# Patient Record
Sex: Female | Born: 1980
Health system: Southern US, Community
[De-identification: ages and names within clinical notes are randomized; demographics above are authoritative.]

## PROBLEM LIST (undated history)

## (undated) DIAGNOSIS — I8393 Asymptomatic varicose veins of bilateral lower extremities: Secondary | ICD-10-CM

## (undated) HISTORY — DX: Asymptomatic varicose veins of bilateral lower extremities: I83.93

## (undated) HISTORY — PX: CHOLECYSTECTOMY: SHX55

## (undated) HISTORY — PX: LASER ABLATION: SHX1947

---

## 2007-04-30 ENCOUNTER — Ambulatory Visit: Payer: Self-pay | Admitting: Family Medicine

## 2007-09-20 ENCOUNTER — Inpatient Hospital Stay: Payer: Self-pay | Admitting: Obstetrics and Gynecology

## 2011-03-12 ENCOUNTER — Ambulatory Visit: Payer: Self-pay | Admitting: Family Medicine

## 2011-08-09 ENCOUNTER — Inpatient Hospital Stay: Payer: Self-pay | Admitting: Obstetrics and Gynecology

## 2011-08-09 LAB — CBC WITH DIFFERENTIAL/PLATELET
Basophil %: 0.5 %
Eosinophil #: 0 10*3/uL (ref 0.0–0.7)
Eosinophil %: 0 %
HCT: 35.1 % (ref 35.0–47.0)
HGB: 12 g/dL (ref 12.0–16.0)
Lymphocyte #: 0.9 10*3/uL — ABNORMAL LOW (ref 1.0–3.6)
Lymphocyte %: 8.1 %
MCHC: 34.3 g/dL (ref 32.0–36.0)
Neutrophil #: 9.2 10*3/uL — ABNORMAL HIGH (ref 1.4–6.5)
RBC: 3.63 10*6/uL — ABNORMAL LOW (ref 3.80–5.20)

## 2011-08-10 LAB — HEMATOCRIT: HCT: 34.2 % — ABNORMAL LOW (ref 35.0–47.0)

## 2014-01-27 ENCOUNTER — Inpatient Hospital Stay: Payer: Self-pay | Admitting: Surgery

## 2014-01-27 LAB — LIPASE, BLOOD: Lipase: 171 U/L (ref 73–393)

## 2014-01-27 LAB — TROPONIN I: Troponin-I: <0.02 ng/mL

## 2014-01-27 LAB — CBC
HCT: 40.8 % (ref 35.0–47.0)
HGB: 13.3 g/dL (ref 12.0–16.0)
MCH: 31 pg (ref 26.0–34.0)
MCHC: 32.6 g/dL (ref 32.0–36.0)
MCV: 95 fL (ref 80–100)
Platelet: 162 10*3/uL (ref 150–440)
RBC: 4.3 10*6/uL (ref 3.80–5.20)
RDW: 12.7 % (ref 11.5–14.5)
WBC: 8.7 10*3/uL (ref 3.6–11.0)

## 2014-01-27 LAB — BASIC METABOLIC PANEL
ANION GAP: 12 (ref 7–16)
BUN: 16 mg/dL (ref 7–18)
CALCIUM: 8.7 mg/dL (ref 8.5–10.1)
CREATININE: 0.74 mg/dL (ref 0.60–1.30)
Chloride: 104 mmol/L (ref 98–107)
Co2: 23 mmol/L (ref 21–32)
EGFR (Non-African Amer.): >60
GLUCOSE: 131 mg/dL — AB (ref 65–99)
OSMOLALITY: 281 (ref 275–301)
POTASSIUM: 3.6 mmol/L (ref 3.5–5.1)
Sodium: 139 mmol/L (ref 136–145)

## 2014-01-27 LAB — HEPATIC FUNCTION PANEL A (ARMC)
ALT: 42 U/L
AST: 47 U/L — AB (ref 15–37)
Albumin: 4.1 g/dL (ref 3.4–5.0)
Alkaline Phosphatase: 51 U/L
Bilirubin, Direct: 0.3 mg/dL — ABNORMAL HIGH (ref 0.00–0.20)
Bilirubin,Total: 0.7 mg/dL (ref 0.2–1.0)
TOTAL PROTEIN: 7.5 g/dL (ref 6.4–8.2)

## 2014-01-28 LAB — CBC WITH DIFFERENTIAL/PLATELET
Basophil #: 0 10*3/uL (ref 0.0–0.1)
Basophil %: 0.5 %
Eosinophil #: 0.1 10*3/uL (ref 0.0–0.7)
Eosinophil %: 1 %
HCT: 40.9 % (ref 35.0–47.0)
HGB: 13.6 g/dL (ref 12.0–16.0)
Lymphocyte #: 2.4 10*3/uL (ref 1.0–3.6)
Lymphocyte %: 36.2 %
MCH: 31.5 pg (ref 26.0–34.0)
MCHC: 33.3 g/dL (ref 32.0–36.0)
MCV: 94 fL (ref 80–100)
Monocyte #: 0.5 x10 3/mm (ref 0.2–0.9)
Monocyte %: 7.9 %
Neutrophil #: 3.6 10*3/uL (ref 1.4–6.5)
Neutrophil %: 54.4 %
Platelet: 151 10*3/uL (ref 150–440)
RBC: 4.33 10*6/uL (ref 3.80–5.20)
RDW: 13.1 % (ref 11.5–14.5)
WBC: 6.7 10*3/uL (ref 3.6–11.0)

## 2014-01-28 LAB — COMPREHENSIVE METABOLIC PANEL
Albumin: 3.4 g/dL (ref 3.4–5.0)
Alkaline Phosphatase: 58 U/L
Anion Gap: 9 (ref 7–16)
BUN: 10 mg/dL (ref 7–18)
Bilirubin,Total: 1 mg/dL (ref 0.2–1.0)
Calcium, Total: 8.1 mg/dL — ABNORMAL LOW (ref 8.5–10.1)
Chloride: 106 mmol/L (ref 98–107)
Co2: 26 mmol/L (ref 21–32)
Creatinine: 0.7 mg/dL (ref 0.60–1.30)
EGFR (African American): >60
EGFR (Non-African Amer.): >60
Glucose: 87 mg/dL (ref 65–99)
Osmolality: 280 (ref 275–301)
Potassium: 3.6 mmol/L (ref 3.5–5.1)
SGOT(AST): 115 U/L — ABNORMAL HIGH (ref 15–37)
SGPT (ALT): 144 U/L — ABNORMAL HIGH
Sodium: 141 mmol/L (ref 136–145)
Total Protein: 6.5 g/dL (ref 6.4–8.2)

## 2014-02-01 LAB — PATHOLOGY REPORT

## 2014-10-07 NOTE — Op Note (Signed)
PATIENT NAME:  [name], Angie A MR#:  [id number] DATE OF BIRTH:  Nov 19, 1980  DATE OF PROCEDURE:  01/28/2014  PREOPERATIVE DIAGNOSIS:  Acute cholecystitis.   POSTOPERATIVE DIAGNOSIS:  Acute cholecystitis.   PROCEDURE PERFORMED:  Laparoscopic cholecystectomy.   ANESTHESIA: General endotracheal.   SPECIMEN: Gallbladder.   COMPLICATIONS: None.   INDICATION FOR SURGERY: Angie Graves is a pleasant 34 year old who presents with 1 day of right upper quadrant pain which is recurrent. She was noted to have what appeared to be large stones, potentially nonmobile, stuck in the neck of the gallbladder. She was thus brought to the operating room for laparoscopic cholecystectomy.   DETAILS OF PROCEDURE: Informed consent informed consent was obtained. Angie Graves was brought to the operating room suite. She was induced. Endotracheal tube was placed. General anesthesia was administered. Her abdomen was prepped and draped in standard surgical fashion. A timeout was then performed correctly identifying the patient name, operative site and procedure to be performed.   A supraumbilical incision made and was deepened down to the fascia. The fascia was incised. The peritoneum was entered. Two stay sutures were placed in the fasciotomy. A Hasson trocar was placed in the abdomen. The abdomen was insufflated. A camera was placed. The gallbladder was visualized. An 11-mm epigastric trocar and two 5-mm right subcostal trocars were placed at the midclavicular and anterior axillary lines. The gallbladder was then lifted over the dome of the liver. The cystic artery and cystic duct were dissected out. A critical view was obtained. They were clipped and ligated. The gallbladder was then taken off the gallbladder fossa and brought out through an Endo Catch bag through the umbilical incision. The gallbladder fossa was made hemostatic.   The abdomen was then irrigated. All ports were taken down under direct visualization. The  abdomen was desufflated. The supraumbilical fascia was closed with figure-of-eight 0-Vicryl. All incisions were infiltrated with 1% lidocaine with epinephrine and closed with interrupted 4-0 Monocryl deep dermal sutures. Steri-Strips, Telfa gauze and Tegaderm were then used to complete the dressing. The patient was then awakened, extubated, and brought to the postanesthesia care unit. There were no immediate complications. Needle, sponge, and instrument counts were correct at the end of the procedure.    ____________________________ Si Raiderhristopher A. Ovadia Lopp, MD cal:lt D: 01/29/2014 10:00:40 ET T: 01/29/2014 11:14:31 ET JOB#: [id number]  cc: Cristal Deerhristopher A. Jyllian Haynie, MD, <Dictator> Jarvis NewcomerHRISTOPHER A Jas Betten MD ELECTRONICALLY SIGNED 02/06/2014 21:08

## 2014-10-07 NOTE — H&P (Signed)
   Subjective/Chief Complaint Epigastric/RUQ pain x 5 hours, recurrent   History of Present Illness Angie Graves is a pleasant 34 yo F with relatively no PMH who presents with approx 5 hours of epigastric and RUQ pain.  Occured approx 1 pm after eating oatmeal.  Had similar episode 2 days ago which resolved spontaneously.  Prior episode 5/10, this one 10/10 but much improved.  No N/V/D.  No unusual ingestions, no sick contacts.  U/S shows multiple gallstones without pericholecystic fluid and borderline gb wall thickening at 5 mm   Code Status Full Code   Past Med/Surgical Hx:  Denies:   ALLERGIES:  No Known Allergies:   Family and Social History:  Family History Negative   Social History negative tobacco, negative ETOH   + Tobacco Current (within 1 year)   Place of Living Home   Review of Systems:  Subjective/Chief Complaint RUQ pain/Epigastric pain, recurrent   Fever/Chills No   Cough No   Sputum No   Abdominal Pain Yes   Diarrhea No   Constipation No   Nausea/Vomiting No   SOB/DOE No   Chest Pain No   Dysuria No   Tolerating Diet Yes   Physical Exam:  GEN well developed, well nourished, no acute distress   HEENT pink conjunctivae, PERRL, moist oral mucosa   RESP normal resp effort  clear BS  no use of accessory muscles   CARD regular rate  no murmur  no thrills   ABD denies tenderness  no hernia  soft  normal BS  no Abdominal Bruits  no Adominal Mass   EXTR negative cyanosis/clubbing, negative edema   SKIN normal to palpation, No rashes, No ulcers   NEURO cranial nerves intact, negative rigidity, negative tremor, follows commands, strength:, motor/sensory function intact   PSYCH alert, A+O to time, place, person, good insight    Assessment/Admission Diagnosis 34 yo F with RUQ/epigastric pain, gallstones.  Recurrent.  Likely symptomatic cholelithiasis   Plan I have offered Angie Graves cholecystectomy.  Will admit for resuscitation and pain  control.  Plan for cholecystectomy tomorrow.   Electronic Signatures: Jarvis NewcomerLundquist, Markavious Micco A (MD)  (Signed 14-Aug-15 18:48)  Authored: CHIEF COMPLAINT and HISTORY, PAST MEDICAL/SURGIAL HISTORY, ALLERGIES, FAMILY AND SOCIAL HISTORY, REVIEW OF SYSTEMS, PHYSICAL EXAM, ASSESSMENT AND PLAN   Last Updated: 14-Aug-15 18:48 by Jarvis NewcomerLundquist, Jarren Para A (MD)

## 2014-10-24 NOTE — H&P (Signed)
L&D Evaluation:  History:   HPI 34 y/o G2P1101 @ 39+wks EDC 08/13/11 arrives with c/o contractions beginnng this am, denies leaking fluid, sm show baby is active. [facility]    Presents with contractions    Patient's Medical History No Chronic Illness    Patient's Surgical History none    Medications Pre Natal Vitamins    Allergies NKDA    Social History none    Family History Non-Contributory   ROS:   ROS All systems were reviewed.  HEENT, CNS, GI, GU, Respiratory, CV, Renal and Musculoskeletal systems were found to be normal.   Exam:   Vital Signs stable    Urine Protein not completed    General no apparent distress    Mental Status clear    Chest clear    Heart normal sinus rhythm    Abdomen gravid, non-tender    Estimated Fetal Weight Average for gestational age    Fetal Position vtx    Fundal Height term    Back no CVAT    Edema no edema    Reflexes 1+    Clonus negative    Pelvic no external lesions, 5cm BBOW nl show per RN exam    Mebranes Intact    FHT normal rate with no decels, 130's 140's avg variability occ accels (variability reduced after pain meds)    Ucx regular, Q 4/6 mins    Skin dry    Lymph no lymphadenopathy   Impression:   Impression active labor   Plan:   Plan monitor contractions and for cervical change, antibiotics for GBBS prophylaxis    Comments Admitted, knows what to expect 2nd baby. IV pain meds have helped/have slowed uc's since arrival. IV ABX infusing. Partner supportive at bedside.   Electronic Signatures: Albertina ParrLugiano, Julionna Marczak B (CNM)  (Signed 23-Feb-13 13:19)  Authored: L&D Evaluation   Last Updated: 23-Feb-13 13:19 by Albertina ParrLugiano, Maralyn Witherell B (CNM)

## 2016-01-13 DIAGNOSIS — R102 Pelvic and perineal pain: Secondary | ICD-10-CM | POA: Insufficient documentation

## 2016-03-17 ENCOUNTER — Ambulatory Visit (INDEPENDENT_AMBULATORY_CARE_PROVIDER_SITE_OTHER): Payer: 59 | Admitting: Vascular Surgery

## 2016-04-10 ENCOUNTER — Encounter (INDEPENDENT_AMBULATORY_CARE_PROVIDER_SITE_OTHER): Payer: Self-pay | Admitting: Vascular Surgery

## 2016-04-10 ENCOUNTER — Ambulatory Visit (INDEPENDENT_AMBULATORY_CARE_PROVIDER_SITE_OTHER): Payer: 59 | Admitting: Vascular Surgery

## 2016-04-10 ENCOUNTER — Ambulatory Visit (INDEPENDENT_AMBULATORY_CARE_PROVIDER_SITE_OTHER): Payer: Self-pay | Admitting: Vascular Surgery

## 2016-04-10 VITALS — BP 111/79 | HR 68 | Resp 17 | Ht 63.0 in | Wt 132.0 lb

## 2016-04-10 DIAGNOSIS — I8312 Varicose veins of left lower extremity with inflammation: Secondary | ICD-10-CM | POA: Diagnosis not present

## 2016-04-10 DIAGNOSIS — I8311 Varicose veins of right lower extremity with inflammation: Secondary | ICD-10-CM

## 2016-04-10 DIAGNOSIS — I83813 Varicose veins of bilateral lower extremities with pain: Secondary | ICD-10-CM

## 2016-04-10 NOTE — Progress Notes (Signed)
Assessment & Plan  Varicose veins of lower extremity with inflammation (454.1  I83.10) Impression: Recommend:  The patient has had successful ablation of the previously incompetent saphenous venous system but still has persistent symptoms of pain and swelling that are having a negative impact on daily life and daily activities.  Patient should undergo injection sclerotherapy to treat the residual varicosities of both legs.  The risks, benefits and alternative therapies were reviewed in detail with the patient. All questions were answered. The patient agrees to proceed with sclerotherapy at their convenience.  The patient will continue wearing the graduated compression stockings and using the over-the-counter pain medications to treat her symptoms. Current Plans Indication: Patient presents with symptomatic varicose veins of the lower extremities bilateral.  Procedure: Sclerotherapy using hypertonic saline mixed with 1% Lidocaine was performed on lower extremities bilateral. Compression wraps were placed. The patient tolerated the procedure well.  Plan: Follow up as needed.

## 2016-05-15 ENCOUNTER — Ambulatory Visit (INDEPENDENT_AMBULATORY_CARE_PROVIDER_SITE_OTHER): Payer: 59 | Admitting: Vascular Surgery

## 2016-05-29 ENCOUNTER — Ambulatory Visit (INDEPENDENT_AMBULATORY_CARE_PROVIDER_SITE_OTHER): Payer: 59 | Admitting: Vascular Surgery

## 2016-05-29 ENCOUNTER — Encounter (INDEPENDENT_AMBULATORY_CARE_PROVIDER_SITE_OTHER): Payer: Self-pay | Admitting: Vascular Surgery

## 2016-05-29 DIAGNOSIS — M79604 Pain in right leg: Secondary | ICD-10-CM

## 2016-05-29 DIAGNOSIS — I83813 Varicose veins of bilateral lower extremities with pain: Secondary | ICD-10-CM

## 2016-05-29 DIAGNOSIS — M79609 Pain in unspecified limb: Secondary | ICD-10-CM | POA: Insufficient documentation

## 2016-05-29 DIAGNOSIS — M79605 Pain in left leg: Secondary | ICD-10-CM

## 2016-05-29 DIAGNOSIS — I872 Venous insufficiency (chronic) (peripheral): Secondary | ICD-10-CM | POA: Insufficient documentation

## 2016-05-29 NOTE — Progress Notes (Signed)
    MRN : [id number]  Angie Graves is a 35 y.o. (12/07/1980) female who presents with chief complaint of No chief complaint on file. .   Procedure:  Sclerotherapy using hypertonic saline mixed with 1% Lidocaine was performed on lower extremities bilateral.  Compression wraps were placed.  The patient tolerated the procedure well.  Plan:  Follow up as arranged

## 2016-06-26 ENCOUNTER — Encounter (INDEPENDENT_AMBULATORY_CARE_PROVIDER_SITE_OTHER): Payer: Self-pay | Admitting: Vascular Surgery

## 2016-06-26 ENCOUNTER — Ambulatory Visit (INDEPENDENT_AMBULATORY_CARE_PROVIDER_SITE_OTHER): Payer: 59 | Admitting: Vascular Surgery

## 2016-06-26 VITALS — BP 99/65 | HR 77 | Resp 16 | Ht 63.0 in | Wt 134.0 lb

## 2016-06-26 DIAGNOSIS — I83813 Varicose veins of bilateral lower extremities with pain: Secondary | ICD-10-CM

## 2016-06-26 DIAGNOSIS — M79604 Pain in right leg: Secondary | ICD-10-CM | POA: Diagnosis not present

## 2016-06-26 DIAGNOSIS — I872 Venous insufficiency (chronic) (peripheral): Secondary | ICD-10-CM | POA: Diagnosis not present

## 2016-06-26 DIAGNOSIS — M79605 Pain in left leg: Secondary | ICD-10-CM | POA: Diagnosis not present

## 2016-06-29 NOTE — Progress Notes (Signed)
   Indication:  Patient presents with symptomatic varicose veins of the bilateral lower extremity.  Procedure:  Sclerotherapy using hypertonic saline mixed with 1% Lidocaine was performed on the bilateral lower extremity.  Compression wraps were placed.  The patient tolerated the procedure well.  Plan:  Follow up as needed.  

## 2016-07-09 DIAGNOSIS — R112 Nausea with vomiting, unspecified: Secondary | ICD-10-CM | POA: Diagnosis not present

## 2016-07-09 DIAGNOSIS — J069 Acute upper respiratory infection, unspecified: Secondary | ICD-10-CM | POA: Diagnosis not present

## 2016-07-31 ENCOUNTER — Ambulatory Visit (INDEPENDENT_AMBULATORY_CARE_PROVIDER_SITE_OTHER): Payer: 59 | Admitting: Vascular Surgery

## 2016-07-31 VITALS — BP 108/70 | HR 69 | Resp 16 | Ht 63.0 in | Wt 132.0 lb

## 2016-07-31 DIAGNOSIS — I872 Venous insufficiency (chronic) (peripheral): Secondary | ICD-10-CM | POA: Diagnosis not present

## 2016-07-31 DIAGNOSIS — I83813 Varicose veins of bilateral lower extremities with pain: Secondary | ICD-10-CM | POA: Diagnosis not present

## 2016-07-31 NOTE — Progress Notes (Signed)
    MRN : [id number]  Angie HamburgerMaria A Alvarenga is a 36 y.o. (06/06/1981) female who presents with chief complaint of  Chief Complaint  Patient presents with  . Varicose Veins    Sclerotherapy  .   Procedure:  Sclerotherapy using hypertonic saline mixed with 1% Lidocaine was performed on lower extremities bilateral.  Compression wraps were placed.  The patient tolerated the procedure well.  Plan:  Follow up as arranged

## 2016-08-26 DIAGNOSIS — Z01419 Encounter for gynecological examination (general) (routine) without abnormal findings: Secondary | ICD-10-CM | POA: Diagnosis not present

## 2016-08-26 DIAGNOSIS — Z0000 Encounter for general adult medical examination without abnormal findings: Secondary | ICD-10-CM | POA: Diagnosis not present

## 2016-08-29 DIAGNOSIS — Z0000 Encounter for general adult medical examination without abnormal findings: Secondary | ICD-10-CM | POA: Diagnosis not present

## 2016-10-01 DIAGNOSIS — L708 Other acne: Secondary | ICD-10-CM | POA: Diagnosis not present

## 2016-10-01 DIAGNOSIS — L700 Acne vulgaris: Secondary | ICD-10-CM | POA: Diagnosis not present

## 2016-10-27 DIAGNOSIS — I8393 Asymptomatic varicose veins of bilateral lower extremities: Secondary | ICD-10-CM | POA: Insufficient documentation

## 2016-10-27 DIAGNOSIS — Z0000 Encounter for general adult medical examination without abnormal findings: Secondary | ICD-10-CM | POA: Diagnosis not present

## 2016-10-27 DIAGNOSIS — L700 Acne vulgaris: Secondary | ICD-10-CM | POA: Insufficient documentation

## 2017-01-08 DIAGNOSIS — N889 Noninflammatory disorder of cervix uteri, unspecified: Secondary | ICD-10-CM | POA: Diagnosis not present

## 2017-01-08 DIAGNOSIS — N870 Mild cervical dysplasia: Secondary | ICD-10-CM | POA: Diagnosis not present

## 2017-01-29 DIAGNOSIS — R102 Pelvic and perineal pain: Secondary | ICD-10-CM | POA: Diagnosis not present

## 2017-01-29 DIAGNOSIS — N921 Excessive and frequent menstruation with irregular cycle: Secondary | ICD-10-CM | POA: Diagnosis not present

## 2017-04-28 DIAGNOSIS — I8393 Asymptomatic varicose veins of bilateral lower extremities: Secondary | ICD-10-CM | POA: Diagnosis not present

## 2017-04-28 DIAGNOSIS — L700 Acne vulgaris: Secondary | ICD-10-CM | POA: Diagnosis not present

## 2017-04-28 DIAGNOSIS — J069 Acute upper respiratory infection, unspecified: Secondary | ICD-10-CM | POA: Diagnosis not present

## 2017-06-01 DIAGNOSIS — Z23 Encounter for immunization: Secondary | ICD-10-CM | POA: Diagnosis not present

## 2017-08-27 DIAGNOSIS — Z01419 Encounter for gynecological examination (general) (routine) without abnormal findings: Secondary | ICD-10-CM | POA: Diagnosis not present

## 2017-09-10 DIAGNOSIS — H60503 Unspecified acute noninfective otitis externa, bilateral: Secondary | ICD-10-CM | POA: Diagnosis not present

## 2017-10-28 DIAGNOSIS — I8393 Asymptomatic varicose veins of bilateral lower extremities: Secondary | ICD-10-CM | POA: Diagnosis not present

## 2017-10-28 DIAGNOSIS — L700 Acne vulgaris: Secondary | ICD-10-CM | POA: Diagnosis not present

## 2017-10-28 DIAGNOSIS — Z0000 Encounter for general adult medical examination without abnormal findings: Secondary | ICD-10-CM | POA: Diagnosis not present

## 2018-04-30 DIAGNOSIS — L700 Acne vulgaris: Secondary | ICD-10-CM | POA: Diagnosis not present

## 2018-04-30 DIAGNOSIS — H60509 Unspecified acute noninfective otitis externa, unspecified ear: Secondary | ICD-10-CM | POA: Diagnosis not present

## 2018-04-30 DIAGNOSIS — I8393 Asymptomatic varicose veins of bilateral lower extremities: Secondary | ICD-10-CM | POA: Diagnosis not present

## 2018-10-25 DIAGNOSIS — Z131 Encounter for screening for diabetes mellitus: Secondary | ICD-10-CM | POA: Diagnosis not present

## 2018-10-25 DIAGNOSIS — Z13220 Encounter for screening for lipoid disorders: Secondary | ICD-10-CM | POA: Diagnosis not present

## 2018-10-25 DIAGNOSIS — Z0000 Encounter for general adult medical examination without abnormal findings: Secondary | ICD-10-CM | POA: Diagnosis not present

## 2018-10-25 DIAGNOSIS — R8290 Unspecified abnormal findings in urine: Secondary | ICD-10-CM | POA: Diagnosis not present

## 2018-11-01 DIAGNOSIS — L700 Acne vulgaris: Secondary | ICD-10-CM | POA: Diagnosis not present

## 2018-11-01 DIAGNOSIS — H6093 Unspecified otitis externa, bilateral: Secondary | ICD-10-CM | POA: Diagnosis not present

## 2018-11-01 DIAGNOSIS — Z0000 Encounter for general adult medical examination without abnormal findings: Secondary | ICD-10-CM | POA: Diagnosis not present

## 2019-06-03 ENCOUNTER — Ambulatory Visit (INDEPENDENT_AMBULATORY_CARE_PROVIDER_SITE_OTHER): Payer: 59 | Admitting: Nurse Practitioner

## 2019-06-03 ENCOUNTER — Other Ambulatory Visit: Payer: Self-pay

## 2019-06-03 ENCOUNTER — Encounter (INDEPENDENT_AMBULATORY_CARE_PROVIDER_SITE_OTHER): Payer: Self-pay

## 2019-06-03 ENCOUNTER — Encounter (INDEPENDENT_AMBULATORY_CARE_PROVIDER_SITE_OTHER): Payer: Self-pay | Admitting: Nurse Practitioner

## 2019-06-03 VITALS — BP 110/68 | HR 76 | Resp 16 | Wt 154.6 lb

## 2019-06-03 DIAGNOSIS — I83813 Varicose veins of bilateral lower extremities with pain: Secondary | ICD-10-CM

## 2019-06-03 DIAGNOSIS — M79604 Pain in right leg: Secondary | ICD-10-CM

## 2019-06-03 DIAGNOSIS — M79605 Pain in left leg: Secondary | ICD-10-CM | POA: Diagnosis not present

## 2019-06-03 DIAGNOSIS — Z30430 Encounter for insertion of intrauterine contraceptive device: Secondary | ICD-10-CM | POA: Insufficient documentation

## 2019-06-04 ENCOUNTER — Encounter (INDEPENDENT_AMBULATORY_CARE_PROVIDER_SITE_OTHER): Payer: Self-pay | Admitting: Nurse Practitioner

## 2019-06-04 NOTE — Progress Notes (Signed)
SUBJECTIVE:  Patient ID: Angie Graves, female    DOB: 1981/06/01, 38 y.o.   MRN: [id number] Chief Complaint  Patient presents with  . Follow-up    ref Hande for varicose veins    HPI  Angie Graves is a 38 y.o. female that has been seen in our practice previously for varicose veins.  The patient does report that she had an ablation previously and records show that this was likely done sometime in 2017.  The patient also underwent sclerotherapy for her varicosities bilaterally.  Since that time the patient stated that the pain was better however it is returned.  She also states that her varicosities are much more visible and protruding and it has now become a dull, achy, throbbing and itching sensation.  This causes her significant trouble throughout the day.  She states that this is worse at the end of the day after she has been up on her feet all day.  And make certain everyday activities of daily living difficult such as grocery shopping and cleaning her home.  She denies any fever, chills, nausea, vomiting or diarrhea.  She denies any chest pain or shortness of breath.  Past Medical History:  Diagnosis Date  . Varicose veins of both lower extremities     Past Surgical History:  Procedure Laterality Date  . CHOLECYSTECTOMY    . LASER ABLATION Bilateral     Social History   Socioeconomic History  . Marital status: Single    Spouse name: Not on file  . Number of children: Not on file  . Years of education: Not on file  . Highest education level: Not on file  Occupational History  . Not on file  Tobacco Use  . Smoking status: Never Smoker  . Smokeless tobacco: Never Used  Substance and Sexual Activity  . Alcohol use: No  . Drug use: No  . Sexual activity: Not on file  Other Topics Concern  . Not on file  Social History Narrative  . Not on file   Social Determinants of Health   Financial Resource Strain:   . Difficulty of Paying Living Expenses: Not on file    Food Insecurity:   . Worried About Charity fundraiser in the Last Year: Not on file  . Ran Out of Food in the Last Year: Not on file  Transportation Needs:   . Lack of Transportation (Medical): Not on file  . Lack of Transportation (Non-Medical): Not on file  Physical Activity:   . Days of Exercise per Week: Not on file  . Minutes of Exercise per Session: Not on file  Stress:   . Feeling of Stress : Not on file  Social Connections:   . Frequency of Communication with Friends and Family: Not on file  . Frequency of Social Gatherings with Friends and Family: Not on file  . Attends Religious Services: Not on file  . Active Member of Clubs or Organizations: Not on file  . Attends Archivist Meetings: Not on file  . Marital Status: Not on file  Intimate Partner Violence:   . Fear of Current or Ex-Partner: Not on file  . Emotionally Abused: Not on file  . Physically Abused: Not on file  . Sexually Abused: Not on file    Family History  Family history unknown: Yes    No Known Allergies   Review of Systems   Review of Systems: Negative Unless Checked Constitutional: [] Weight loss  [] Fever  []   Chills Cardiac: Chest pain    Atrial Fibrillation  Palpitations   Shortness of breath when laying flat   Shortness of breath with exertion. Shortness of breath at rest Vascular:  Pain in legs with walking   Pain in legs with standing Pain in legs when laying flat   Claudication    Pain in feet when laying flat    History of DVT   Phlebitis   Swelling in legs   Varicose veins   Non-healing ulcers Pulmonary:   Uses home oxygen   Productive cough   Hemoptysis   Wheeze  COPD   Asthma Neurologic:  Dizziness   Seizures  Blackouts History of stroke   History of TIA  Aphasia   Temporary Blindness   Weakness or numbness in arm   Weakness or numbness in leg Musculoskeletal:   Joint swelling   Joint pain   Low back pain    History of Knee Replacement Arthritis back Surgeries   Spinal Stenosis    Hematologic:  Easy bruising  Easy bleeding   Hypercoagulable state   Anemic Gastrointestinal:  Diarrhea   Vomiting  Gastroesophageal reflux/heartburn   Difficulty swallowing. Abdominal pain Genitourinary:  Chronic kidney disease   Difficult urination  Anuric   Blood in urine Frequent urination  Burning with urination   Hematuria Skin:  Rashes   Ulcers Wounds Psychological:  History of anxiety    History of major depression   Memory Difficulties      OBJECTIVE:   Physical Exam  BP 110/68 (BP Location: Right Arm)   Pulse 76   Resp 16   Wt 154 lb 9.6 oz (70.1 kg)   BMI 27.39 kg/m   Gen: WD/WN, NAD Head: [place]/AT, No temporalis wasting.  Ear/Nose/Throat: Hearing grossly intact, nares w/o erythema or drainage Eyes: PER, EOMI, sclera nonicteric.  Neck: Supple, no masses.  No JVD.  Pulmonary:  Good air movement, no use of accessory muscles.  Cardiac: RRR Vascular:  Very prominent varicosities bilaterally.  4 to 5 mm bilaterally.  Varicosities running from her groin to ankle.  Scattered spider varicosities bilaterally Vessel Right Left  Radial Palpable Palpable  Dorsalis Pedis Palpable Palpable  Posterior Tibial Palpable Palpable   Gastrointestinal: soft, non-distended. No guarding/no peritoneal signs.  Musculoskeletal: M/S 5/5 throughout.  No deformity or atrophy.  Neurologic: Pain and light touch intact in extremities.  Symmetrical.  Speech is fluent. Motor exam as listed above. Psychiatric: Judgment intact, Mood & affect appropriate for pt's clinical situation. Dermatologic: No Venous rashes. No Ulcers Noted.  No changes consistent with cellulitis. Lymph : No Cervical lymphadenopathy, no lichenification or skin changes of chronic lymphedema.       ASSESSMENT AND PLAN:  1. Varicose veins of both lower extremities with pain Patient continues to have some  significant varicosities bilaterally.  We will obtain bilateral venous reflux studies in order to assess the bilateral great saphenous veins to determine if recannulation has occurred as well as other possible options for treatment.  Patient will have her studies at her convenience.  2. Pain in both lower extremities Based upon description of pain this is likely related to her varicose veins.  Patient advised to utilize ibuprofen or other NSAIDs for pain   Current Outpatient Medications on File Prior to Visit  Medication Sig Dispense Refill  . Levonorgestrel (MIRENA, 52 MG, IU) by Intrauterine route.    . tretinoin (RETIN-A) 0.05 % cream Wash face and wait at least 20 minutes.  Apply a pea size  amount to the face except upper eyelids. Skip a few nights if you get too dry    . HYDROMET 5-1.5 MG/5ML syrup     . omeprazole (PRILOSEC) 20 MG capsule     . spironolactone (ALDACTONE) 25 MG tablet Take 50 mg by mouth.     No current facility-administered medications on file prior to visit.    There are no Patient Instructions on file for this visit. No follow-ups on file.   Georgiana Spinner, NP  This note was completed with Office manager.  Any errors are purely unintentional.

## 2019-07-01 ENCOUNTER — Other Ambulatory Visit (INDEPENDENT_AMBULATORY_CARE_PROVIDER_SITE_OTHER): Payer: Self-pay | Admitting: Vascular Surgery

## 2019-07-01 DIAGNOSIS — I83819 Varicose veins of unspecified lower extremities with pain: Secondary | ICD-10-CM

## 2019-07-04 ENCOUNTER — Ambulatory Visit (INDEPENDENT_AMBULATORY_CARE_PROVIDER_SITE_OTHER): Payer: 59 | Admitting: Vascular Surgery

## 2019-07-04 ENCOUNTER — Other Ambulatory Visit: Payer: Self-pay

## 2019-07-04 ENCOUNTER — Encounter (INDEPENDENT_AMBULATORY_CARE_PROVIDER_SITE_OTHER): Payer: Self-pay | Admitting: Vascular Surgery

## 2019-07-04 ENCOUNTER — Ambulatory Visit (INDEPENDENT_AMBULATORY_CARE_PROVIDER_SITE_OTHER): Payer: 59

## 2019-07-04 VITALS — BP 106/67 | HR 73 | Resp 16 | Wt 158.6 lb

## 2019-07-04 DIAGNOSIS — M79605 Pain in left leg: Secondary | ICD-10-CM

## 2019-07-04 DIAGNOSIS — I83819 Varicose veins of unspecified lower extremities with pain: Secondary | ICD-10-CM | POA: Diagnosis not present

## 2019-07-04 DIAGNOSIS — M79604 Pain in right leg: Secondary | ICD-10-CM | POA: Diagnosis not present

## 2019-07-04 DIAGNOSIS — I83813 Varicose veins of bilateral lower extremities with pain: Secondary | ICD-10-CM | POA: Diagnosis not present

## 2019-07-04 DIAGNOSIS — I872 Venous insufficiency (chronic) (peripheral): Secondary | ICD-10-CM | POA: Diagnosis not present

## 2019-07-04 NOTE — Progress Notes (Signed)
MRN : [id number]  Angie Graves is a 39 y.o. ([birth year]) female who presents with chief complaint of  Chief Complaint  Patient presents with  . Follow-up    ultrasound follow up  .  History of Present Illness:   The patient returns for followup evaluation many months after her initial visit. The patient continues to have pain in the lower extremities with dependency. The pain is lessened with elevation. Graduated compression stockings, Class I (20-30 mmHg), have been worn but the stockings do not eliminate the leg pain.  She notes the left leg hurts more than the right leg.  Over-the-counter analgesics do not improve the symptoms. The degree of discomfort continues to interfere with daily activities. The patient notes the pain in the legs is causing problems with daily exercise, at the workplace and even with household activities and maintenance such as standing in the kitchen preparing meals and doing dishes.   Venous ultrasound shows normal deep venous system, no evidence of acute or chronic DVT.  Superficial reflux is present in the  Right GSV and SSV and the left GSV  Current Meds  Medication Sig  . HYDROMET 5-1.5 MG/5ML syrup   . Levonorgestrel (MIRENA, 52 MG, IU) by Intrauterine route.  Marland Kitchen omeprazole (PRILOSEC) 20 MG capsule   . spironolactone (ALDACTONE) 25 MG tablet Take 50 mg by mouth.  . tretinoin (RETIN-A) 0.05 % cream Wash face and wait at least 20 minutes.  Apply a pea size amount to the face except upper eyelids. Skip a few nights if you get too dry    Past Medical History:  Diagnosis Date  . Varicose veins of both lower extremities     Past Surgical History:  Procedure Laterality Date  . CHOLECYSTECTOMY    . LASER ABLATION Bilateral     Social History Social History   Tobacco Use  . Smoking status: Never Smoker  . Smokeless tobacco: Never Used  Substance Use Topics  . Alcohol use: No  . Drug use: No    Family History Family History  Family  history unknown: Yes    No Known Allergies   REVIEW OF SYSTEMS (Negative unless checked)  Constitutional: [] Weight loss  [] Fever  [] Chills Cardiac: [] Chest pain   [] Chest pressure   [] Palpitations   [] Shortness of breath when laying flat   [] Shortness of breath with exertion. Vascular:  [] Pain in legs with walking   [x] Pain in legs at rest  [] History of DVT   [] Phlebitis   [x] Swelling in legs   [x] Varicose veins   [] Non-healing ulcers Pulmonary:   [] Uses home oxygen   [] Productive cough   [] Hemoptysis   [] Wheeze  [] COPD   [] Asthma Neurologic:  [] Dizziness   [] Seizures   [] History of stroke   [] History of TIA  [] Aphasia   [] Vissual changes   [] Weakness or numbness in arm   [] Weakness or numbness in leg Musculoskeletal:   [] Joint swelling   [] Joint pain   [] Low back pain Hematologic:  [] Easy bruising  [] Easy bleeding   [] Hypercoagulable state   [] Anemic Gastrointestinal:  [] Diarrhea   [] Vomiting  [] Gastroesophageal reflux/heartburn   [] Difficulty swallowing. Genitourinary:  [] Chronic kidney disease   [] Difficult urination  [] Frequent urination   [] Blood in urine Skin:  [] Rashes   [] Ulcers  Psychological:  [] History of anxiety   []  History of major depression.  Physical Examination  Vitals:   07/04/19 1616  BP: 106/67  Pulse: 73  Resp: 16  Weight: 158 lb 9.6 oz (  71.9 kg)   Body mass index is 28.09 kg/m. Gen: WD/WN, NAD Head: [place]/AT, No temporalis wasting.  Ear/Nose/Throat: Hearing grossly intact, nares w/o erythema or drainage Eyes: PER, EOMI, sclera nonicteric.  Neck: Supple, no large masses.   Pulmonary:  Good air movement, no audible wheezing bilaterally, no use of accessory muscles.  Cardiac: RRR, no JVD Vascular: Large varicosities present extensively greater than 10 mm bilaterally.  Mild venous stasis changes to the legs bilaterally.  1+ soft pitting edema Vessel Right Left  PT Palpable Palpable  DP Palpable Palpable  Gastrointestinal: Non-distended. No guarding/no  peritoneal signs.  Musculoskeletal: M/S 5/5 throughout.  No deformity or atrophy.  Neurologic: CN 2-12 intact. Symmetrical.  Speech is fluent. Motor exam as listed above. Psychiatric: Judgment intact, Mood & affect appropriate for pt's clinical situation. Dermatologic: No rashes or ulcers noted.  No changes consistent with cellulitis. Lymph : No lichenification or skin changes of chronic lymphedema.  CBC Lab Results  Component Value Date   WBC 6.7 01/28/2014   HGB 13.6 01/28/2014   HCT 40.9 01/28/2014   MCV 94 01/28/2014   PLT 151 01/28/2014    BMET    Component Value Date/Time   NA 141 01/28/2014 0435   K 3.6 01/28/2014 0435   CL 106 01/28/2014 0435   CO2 26 01/28/2014 0435   GLUCOSE 87 01/28/2014 0435   BUN 10 01/28/2014 0435   CREATININE 0.70 01/28/2014 0435   CALCIUM 8.1 (L) 01/28/2014 0435   GFRNONAA >60 01/28/2014 0435   GFRAA >60 01/28/2014 0435   CrCl cannot be calculated (Patient's most recent lab result is older than the maximum 21 days allowed.).  COAG No results found for: INR, PROTIME  Radiology No results found.    Assessment/Plan 1. Varicose veins of both lower extremities with pain Recommend  I have reviewed my previous  discussion with the patient regarding  varicose veins and why they cause symptoms. Patient will continue  wearing graduated compression stockings class 1 on a daily basis, beginning first thing in the morning and removing them in the evening.    In addition, behavioral modification including elevation during the day was again discussed and this will continue.  The patient has utilized over the counter pain medications and has been exercising.  However, at this time conservative therapy has not alleviated the patient's symptoms of leg pain and swelling  Recommend: laser ablation of the right and  left great saphenous veins and the right small saphenous vein to eliminate the symptoms of pain and swelling of the lower extremities  caused by the severe superficial venous reflux disease.   Left leg will be treated first.  2. Chronic venous insufficiency No surgery or intervention at this point in time.    I have had a long discussion with the patient regarding venous insufficiency and why it  causes symptoms. I have discussed with the patient the chronic skin changes that accompany venous insufficiency and the long term sequela such as infection and ulceration.  Patient will begin wearing graduated compression stockings class 1 (20-30 mmHg) or compression wraps on a daily basis a prescription was given. The patient will put the stockings on first thing in the morning and removing them in the evening. The patient is instructed specifically not to sleep in the stockings.  Venous ultrasound shows normal deep venous system, no evidence of acute or chronic DVT.  Superficial reflux is present in the  Right GSV and SSV and the left GSV  In  addition, behavioral modification including several periods of elevation of the lower extremities during the day will be continued. I have demonstrated that proper elevation is a position with the ankles at heart level.  The patient is instructed to begin routine exercise, especially walking on a daily basis  3. Pain in both lower extremities See #1&2  Levora Dredge, MD  07/04/2019 4:19 PM

## 2019-09-03 ENCOUNTER — Ambulatory Visit: Payer: Self-pay | Attending: Internal Medicine

## 2019-09-03 DIAGNOSIS — Z23 Encounter for immunization: Secondary | ICD-10-CM

## 2019-09-03 NOTE — Progress Notes (Signed)
   Covid-19 Vaccination Clinic  Name:  Angie Graves    MRN: [id number] DOB: [birth year]  09/03/2019  Angie Graves was observed post Covid-19 immunization for 15 minutes without incident. She was provided with Vaccine Information Sheet and instruction to access the V-Safe system.   Angie Graves was instructed to call 911 with any severe reactions post vaccine: Marland Kitchen Difficulty breathing  . Swelling of face and throat  . A fast heartbeat  . A bad rash all over body  . Dizziness and weakness   Immunizations Administered    Name Date Dose VIS Date Route   Pfizer COVID-19 Vaccine 09/03/2019  8:27 AM 0.3 mL 05/27/2019 Intramuscular   Manufacturer: [employer], [employer]   Lot: [id number]   NDC: [address]-7

## 2019-09-24 ENCOUNTER — Ambulatory Visit: Payer: Self-pay

## 2019-09-25 ENCOUNTER — Ambulatory Visit: Payer: Self-pay | Attending: Internal Medicine

## 2019-09-25 DIAGNOSIS — Z23 Encounter for immunization: Secondary | ICD-10-CM

## 2019-09-25 NOTE — Progress Notes (Signed)
   Covid-19 Vaccination Clinic  Name:  RAINNA NEARHOOD    MRN: [id number] DOB: 1980/07/10  09/25/2019  Ms. SELLARS Alvarenga was observed post Covid-19 immunization for 15 minutes without incident. She was provided with Vaccine Information Sheet and instruction to access the V-Safe system.   Ms. DEMIA VIERA was instructed to call 911 with any severe reactions post vaccine: Marland Kitchen Difficulty breathing  . Swelling of face and throat  . A fast heartbeat  . A bad rash all over body  . Dizziness and weakness   Immunizations Administered    Name Date Dose VIS Date Route   Pfizer COVID-19 Vaccine 09/25/2019  2:22 PM 0.3 mL 05/27/2019 Intramuscular   Manufacturer: [employer], [employer]   Lot: [id number]   NDC: [address]-9

## 2019-11-07 ENCOUNTER — Other Ambulatory Visit: Payer: Self-pay | Admitting: Internal Medicine

## 2019-11-07 DIAGNOSIS — R61 Generalized hyperhidrosis: Secondary | ICD-10-CM

## 2019-11-07 DIAGNOSIS — R635 Abnormal weight gain: Secondary | ICD-10-CM

## 2019-11-07 DIAGNOSIS — R5381 Other malaise: Secondary | ICD-10-CM

## 2019-11-07 DIAGNOSIS — T881XXA Other complications following immunization, not elsewhere classified, initial encounter: Secondary | ICD-10-CM

## 2019-11-07 DIAGNOSIS — R5383 Other fatigue: Secondary | ICD-10-CM

## 2019-11-07 DIAGNOSIS — R55 Syncope and collapse: Secondary | ICD-10-CM

## 2020-02-29 ENCOUNTER — Other Ambulatory Visit: Payer: Self-pay | Admitting: Obstetrics and Gynecology

## 2020-02-29 DIAGNOSIS — Z1231 Encounter for screening mammogram for malignant neoplasm of breast: Secondary | ICD-10-CM

## 2020-03-20 ENCOUNTER — Ambulatory Visit
Admission: RE | Admit: 2020-03-20 | Discharge: 2020-03-20 | Disposition: A | Discharge status: Home / Self Care | Payer: 59 | Source: Ambulatory Visit | Attending: Obstetrics and Gynecology | Admitting: Obstetrics and Gynecology

## 2020-03-20 ENCOUNTER — Other Ambulatory Visit: Payer: Self-pay

## 2020-03-20 DIAGNOSIS — Z1231 Encounter for screening mammogram for malignant neoplasm of breast: Secondary | ICD-10-CM

## 2020-04-03 ENCOUNTER — Ambulatory Visit (INDEPENDENT_AMBULATORY_CARE_PROVIDER_SITE_OTHER): Payer: 59 | Admitting: Nurse Practitioner

## 2020-04-03 ENCOUNTER — Encounter (INDEPENDENT_AMBULATORY_CARE_PROVIDER_SITE_OTHER): Payer: Self-pay | Admitting: Nurse Practitioner

## 2020-04-03 ENCOUNTER — Other Ambulatory Visit: Payer: Self-pay

## 2020-04-03 VITALS — BP 125/81 | HR 74 | Resp 16 | Wt 153.8 lb

## 2020-04-03 DIAGNOSIS — I83813 Varicose veins of bilateral lower extremities with pain: Secondary | ICD-10-CM | POA: Diagnosis not present

## 2020-04-03 MED ORDER — TRIAMCINOLONE ACETONIDE 0.5 % EX OINT: 1.0000 "application " | TOPICAL_OINTMENT | Freq: Two times a day (BID) | CUTANEOUS | 0 refills | Status: AC

## 2020-04-09 ENCOUNTER — Encounter (INDEPENDENT_AMBULATORY_CARE_PROVIDER_SITE_OTHER): Payer: Self-pay | Admitting: Nurse Practitioner

## 2020-04-09 NOTE — Progress Notes (Signed)
Subjective:    Patient ID: Angie Graves, female    DOB: July 25, 1980, 39 y.o.   MRN: [id number] Chief Complaint  Patient presents with  . Follow-up    discuss next steps denied for laser through insurance    The patient returns for followup evaluation 9 months after the initial visit. The patient continues to have pain in the lower extremities with dependency. The pain is lessened with elevation. Graduated compression stockings, Class I (20-30 mmHg), have been worn but the stockings do not eliminate the leg pain. Over-the-counter analgesics do not improve the symptoms. The degree of discomfort continues to interfere with daily activities. The patient notes the pain in the legs is causing problems with daily exercise, at the workplace and even with household activities and maintenance such as standing in the kitchen preparing meals and doing dishes. The patient has also developed a superficial thrombophlebitis   Venous ultrasound shows normal deep venous system, no evidence of acute or chronic DVT.  Superficial reflux is present in the right great saphenous vein beginning at the distal thigh extending to the proximal calf.  She also has reflux in the small saphenous vein of the right lower extremity.  The left lower extremity has reflux in the great saphenous vein at the saphenofemoral junction as well as from the distal thigh to proximal calf.   Review of Systems  Cardiovascular: Positive for leg swelling.  Hematological: Bruises/bleeds easily.  All other systems reviewed and are negative.      Objective:   Physical Exam Vitals reviewed.  HENT:     Head: Normocephalic.  Cardiovascular:     Rate and Rhythm: Normal rate.     Pulses: Normal pulses.  Pulmonary:     Effort: Pulmonary effort is normal.  Musculoskeletal:        General: Tenderness present.  Skin:    General: Skin is warm and dry.     Comments: Superficial thrombophlebitis on left   Neurological:     Mental  Status: She is alert and oriented to person, place, and time.  Psychiatric:        Mood and Affect: Mood normal.        Behavior: Behavior normal.        Thought Content: Thought content normal.        Judgment: Judgment normal.     BP 125/81 (BP Location: Right Arm)   Pulse 74   Resp 16   Wt 153 lb 12.8 oz (69.8 kg)   LMP 03/14/2020 (Exact Date) Comment: IUD  BMI 27.24 kg/m   Past Medical History:  Diagnosis Date  . Varicose veins of both lower extremities     Social History   Socioeconomic History  . Marital status: Single    Spouse name: Not on file  . Number of children: Not on file  . Years of education: Not on file  . Highest education level: Not on file  Occupational History  . Not on file  Tobacco Use  . Smoking status: Never Smoker  . Smokeless tobacco: Never Used  Substance and Sexual Activity  . Alcohol use: No  . Drug use: No  . Sexual activity: Not on file  Other Topics Concern  . Not on file  Social History Narrative  . Not on file   Social Determinants of Health   Financial Resource Strain:   . Difficulty of Paying Living Expenses: Not on file  Food Insecurity:   . Worried About Radiation protection practitioner  of Food in the Last Year: Not on file  . Ran Out of Food in the Last Year: Not on file  Transportation Needs:   . Lack of Transportation (Medical): Not on file  . Lack of Transportation (Non-Medical): Not on file  Physical Activity:   . Days of Exercise per Week: Not on file  . Minutes of Exercise per Session: Not on file  Stress:   . Feeling of Stress : Not on file  Social Connections:   . Frequency of Communication with Friends and Family: Not on file  . Frequency of Social Gatherings with Friends and Family: Not on file  . Attends Religious Services: Not on file  . Active Member of Clubs or Organizations: Not on file  . Attends Banker Meetings: Not on file  . Marital Status: Not on file  Intimate Partner Violence:   . Fear of  Current or Ex-Partner: Not on file  . Emotionally Abused: Not on file  . Physically Abused: Not on file  . Sexually Abused: Not on file    Past Surgical History:  Procedure Laterality Date  . CHOLECYSTECTOMY    . LASER ABLATION Bilateral     Family History  Family history unknown: Yes    No Known Allergies     Assessment & Plan:   1. Varicose veins of both lower extremities with pain Recommend  I have reviewed my previous  discussion with the patient regarding  varicose veins and why they cause symptoms. Patient will continue  wearing graduated compression stockings class 1 on a daily basis, beginning first thing in the morning and removing them in the evening.    In addition, behavioral modification including elevation during the day was again discussed and this will continue.  The patient has utilized over the counter pain medications and has been exercising.  However, at this time conservative therapy has not alleviated the patient's symptoms of leg pain and swelling  Recommend: laser ablation of the right great and short saphenous veins and  left great saphenous vein to eliminate the symptoms of pain and swelling of the lower extremities caused by the severe superficial venous reflux disease.  This is also to prevent further recurrence of superficial thrombophlebitis.   Current Outpatient Medications on File Prior to Visit  Medication Sig Dispense Refill  . ergocalciferol (VITAMIN D2) 1.25 MG (50000 UT) capsule Take by mouth once a week.     Marland Kitchen HYDROMET 5-1.5 MG/5ML syrup     . iron polysaccharides (NIFEREX) 150 MG capsule Take by mouth.    . Levonorgestrel (MIRENA, 52 MG, IU) by Intrauterine route.    Marland Kitchen omeprazole (PRILOSEC) 20 MG capsule     . spironolactone (ALDACTONE) 25 MG tablet Take 50 mg by mouth.    . tretinoin (RETIN-A) 0.05 % cream Wash face and wait at least 20 minutes.  Apply a pea size amount to the face except upper eyelids. Skip a few nights if you get  too dry     No current facility-administered medications on file prior to visit.    There are no Patient Instructions on file for this visit. No follow-ups on file.   Georgiana Spinner, NP

## 2020-05-03 ENCOUNTER — Other Ambulatory Visit (INDEPENDENT_AMBULATORY_CARE_PROVIDER_SITE_OTHER): Payer: Self-pay | Admitting: Nurse Practitioner

## 2020-05-03 DIAGNOSIS — I8000 Phlebitis and thrombophlebitis of superficial vessels of unspecified lower extremity: Secondary | ICD-10-CM

## 2020-05-03 DIAGNOSIS — I83813 Varicose veins of bilateral lower extremities with pain: Secondary | ICD-10-CM

## 2020-05-04 ENCOUNTER — Other Ambulatory Visit (INDEPENDENT_AMBULATORY_CARE_PROVIDER_SITE_OTHER): Payer: Self-pay | Admitting: Nurse Practitioner

## 2020-05-04 ENCOUNTER — Ambulatory Visit (INDEPENDENT_AMBULATORY_CARE_PROVIDER_SITE_OTHER): Payer: 59

## 2020-05-04 ENCOUNTER — Other Ambulatory Visit: Payer: Self-pay

## 2020-05-04 ENCOUNTER — Encounter (INDEPENDENT_AMBULATORY_CARE_PROVIDER_SITE_OTHER): Payer: Self-pay | Admitting: Nurse Practitioner

## 2020-05-04 ENCOUNTER — Ambulatory Visit (INDEPENDENT_AMBULATORY_CARE_PROVIDER_SITE_OTHER): Payer: 59 | Admitting: Nurse Practitioner

## 2020-05-04 VITALS — BP 112/73 | HR 73 | Ht 64.0 in | Wt 152.0 lb

## 2020-05-04 DIAGNOSIS — I83819 Varicose veins of unspecified lower extremities with pain: Secondary | ICD-10-CM | POA: Diagnosis not present

## 2020-05-04 DIAGNOSIS — I83813 Varicose veins of bilateral lower extremities with pain: Secondary | ICD-10-CM | POA: Diagnosis not present

## 2020-05-04 DIAGNOSIS — I8000 Phlebitis and thrombophlebitis of superficial vessels of unspecified lower extremity: Secondary | ICD-10-CM

## 2020-05-04 MED ORDER — IBUPROFEN 800 MG PO TABS: 800.0000 mg | ORAL_TABLET | Freq: Three times a day (TID) | ORAL | 5 refills | Status: AC | PRN

## 2020-05-11 ENCOUNTER — Encounter (INDEPENDENT_AMBULATORY_CARE_PROVIDER_SITE_OTHER): Payer: Self-pay | Admitting: Nurse Practitioner

## 2020-05-11 NOTE — Progress Notes (Signed)
Subjective:    Patient ID: Angie Graves, female    DOB: 1980/07/22, 39 y.o.   MRN: [id number] Chief Complaint  Patient presents with  . Follow-up    U/S Follow up    The patient returns for followup evaluation 9 months after the initial visit. The patient continues to have pain in the lower extremities with dependency. The pain is lessened with elevation. Graduated compression stockings, Class I (20-30 mmHg), have been worn but the stockings do not eliminate the leg pain. Over-the-counter analgesics do not improve the symptoms. The degree of discomfort continues to interfere with daily activities. The patient notes the pain in the legs is causing problems with daily exercise, at the workplace and even with household activities and maintenance such as standing in the kitchen preparing meals and doing dishes. The patient previously had a superficial myocarditis that has resolved.  Noninvasive studies today continue to show reflux in her right great saphenous vein.   Review of Systems  Cardiovascular: Positive for leg swelling.  All other systems reviewed and are negative.      Objective:   Physical Exam Vitals reviewed.  HENT:     Head: Normocephalic.  Cardiovascular:     Rate and Rhythm: Normal rate.     Pulses: Normal pulses.  Pulmonary:     Effort: Pulmonary effort is normal.  Skin:    General: Skin is warm and dry.  Neurological:     Mental Status: She is alert and oriented to person, place, and time.  Psychiatric:        Mood and Affect: Mood normal.        Behavior: Behavior normal.        Thought Content: Thought content normal.        Judgment: Judgment normal.     BP 112/73   Pulse 73   Ht 5\' 4"  (1.626 m)   Wt 152 lb (68.9 kg)   BMI 26.09 kg/m   Past Medical History:  Diagnosis Date  . Varicose veins of both lower extremities     Social History   Socioeconomic History  . Marital status: Single    Spouse name: Not on file  . Number of  children: Not on file  . Years of education: Not on file  . Highest education level: Not on file  Occupational History  . Not on file  Tobacco Use  . Smoking status: Never Smoker  . Smokeless tobacco: Never Used  Substance and Sexual Activity  . Alcohol use: No  . Drug use: No  . Sexual activity: Not on file  Other Topics Concern  . Not on file  Social History Narrative  . Not on file   Social Determinants of Health   Financial Resource Strain:   . Difficulty of Paying Living Expenses: Not on file  Food Insecurity:   . Worried About in the Last Year: Not on file  . Ran Out of Food in the Last Year: Not on file  Transportation Needs:   . Lack of Transportation (Medical): Not on file  . Lack of Transportation (Non-Medical): Not on file  Physical Activity:   . Days of Exercise per Week: Not on file  . Minutes of Exercise per Session: Not on file  Stress:   . Feeling of Stress : Not on file  Social Connections:   . Frequency of Communication with Friends and Family: Not on file  . Frequency of Social Gatherings with  Friends and Family: Not on file  . Attends Religious Services: Not on file  . Active Member of Clubs or Organizations: Not on file  . Attends Banker Meetings: Not on file  . Marital Status: Not on file  Intimate Partner Violence:   . Fear of Current or Ex-Partner: Not on file  . Emotionally Abused: Not on file  . Physically Abused: Not on file  . Sexually Abused: Not on file    Past Surgical History:  Procedure Laterality Date  . CHOLECYSTECTOMY    . LASER ABLATION Bilateral     Family History  Family history unknown: Yes    No Known Allergies  CBC Latest Ref Rng & Units 01/28/2014 01/27/2014 08/10/2011  WBC 3.6 - 11.0 x10 3/mm 3 6.7 8.7 -  Hemoglobin 12.0 - 16.0 g/dL 37.9 02.4 -  Hematocrit 35.0 - 47.0 % 40.9 40.8 34.2(L)  Platelets 150 - 440 x10 3/mm 3 151 162 -      CMP     Component Value Date/Time   NA  141 01/28/2014 0435   K 3.6 01/28/2014 0435   CL 106 01/28/2014 0435   CO2 26 01/28/2014 0435   GLUCOSE 87 01/28/2014 0435   BUN 10 01/28/2014 0435   CREATININE 0.70 01/28/2014 0435   CALCIUM 8.1 (L) 01/28/2014 0435   PROT 6.5 01/28/2014 0435   ALBUMIN 3.4 01/28/2014 0435   AST 115 (H) 01/28/2014 0435   ALT 144 (H) 01/28/2014 0435   ALKPHOS 58 01/28/2014 0435   BILITOT 1.0 01/28/2014 0435   GFRNONAA >60 01/28/2014 0435   GFRAA >60 01/28/2014 0435     No results found.     Assessment & Plan:   1. Varicose veins of unspecified lower extremity with pain Recommend  I have reviewed my previous  discussion with the patient regarding  varicose veins and why they cause symptoms. Patient will continue  wearing graduated compression stockings class 1 on a daily basis, beginning first thing in the morning and removing them in the evening.    In addition, behavioral modification including elevation during the day was again discussed and this will continue.  The patient has utilized over the counter pain medications and has been exercising.  However, at this time conservative therapy has not alleviated the patient's symptoms of leg pain and swelling  Recommend: laser ablation of the right great and saphenous veins  to eliminate the symptoms of pain and swelling of the lower extremities caused by the severe superficial venous reflux disease.  This is also to prevent further recurrence of superficial thrombophlebitis. - ibuprofen (ADVIL) 800 MG tablet; Take 1 tablet (800 mg total) by mouth every 8 (eight) hours as needed.  Dispense: 30 tablet; Refill: 5   Current Outpatient Medications on File Prior to Visit  Medication Sig Dispense Refill  . HYDROMET 5-1.5 MG/5ML syrup     . iron polysaccharides (NIFEREX) 150 MG capsule Take by mouth.    . Levonorgestrel (MIRENA, 52 MG, IU) by Intrauterine route.    Marland Kitchen omeprazole (PRILOSEC) 20 MG capsule     . spironolactone (ALDACTONE) 25 MG  tablet Take 50 mg by mouth.    . tretinoin (RETIN-A) 0.05 % cream Wash face and wait at least 20 minutes.  Apply a pea size amount to the face except upper eyelids. Skip a few nights if you get too dry    . triamcinolone ointment (KENALOG) 0.5 % Apply 1 application topically 2 (two) times daily. 30 g 0  No current facility-administered medications on file prior to visit.    There are no Patient Instructions on file for this visit. No follow-ups on file.   Kris Hartmann, NP

## 2021-03-05 ENCOUNTER — Other Ambulatory Visit: Payer: Self-pay | Admitting: Obstetrics and Gynecology

## 2021-03-05 DIAGNOSIS — Z1231 Encounter for screening mammogram for malignant neoplasm of breast: Secondary | ICD-10-CM

## 2021-03-22 ENCOUNTER — Ambulatory Visit
Admission: RE | Admit: 2021-03-22 | Discharge: 2021-03-22 | Disposition: A | Discharge status: Home / Self Care | Payer: 59 | Source: Ambulatory Visit | Attending: Obstetrics and Gynecology | Admitting: Obstetrics and Gynecology

## 2021-03-22 ENCOUNTER — Other Ambulatory Visit: Payer: Self-pay

## 2021-03-22 DIAGNOSIS — Z1231 Encounter for screening mammogram for malignant neoplasm of breast: Secondary | ICD-10-CM | POA: Insufficient documentation

## 2021-03-28 ENCOUNTER — Other Ambulatory Visit: Payer: Self-pay | Admitting: Obstetrics and Gynecology

## 2021-03-28 DIAGNOSIS — R928 Other abnormal and inconclusive findings on diagnostic imaging of breast: Secondary | ICD-10-CM

## 2021-03-28 DIAGNOSIS — R921 Mammographic calcification found on diagnostic imaging of breast: Secondary | ICD-10-CM

## 2021-04-08 ENCOUNTER — Other Ambulatory Visit: Payer: Self-pay

## 2021-04-08 ENCOUNTER — Ambulatory Visit
Admission: RE | Admit: 2021-04-08 | Discharge: 2021-04-08 | Disposition: A | Discharge status: Home / Self Care | Payer: 59 | Source: Ambulatory Visit | Attending: Obstetrics and Gynecology | Admitting: Obstetrics and Gynecology

## 2021-04-08 DIAGNOSIS — R928 Other abnormal and inconclusive findings on diagnostic imaging of breast: Secondary | ICD-10-CM | POA: Insufficient documentation

## 2021-04-08 DIAGNOSIS — R921 Mammographic calcification found on diagnostic imaging of breast: Secondary | ICD-10-CM | POA: Insufficient documentation

## 2021-12-13 IMAGING — MG MM DIGITAL DIAGNOSTIC UNILAT*L* W/ TOMO W/ CAD
5 series · 6 of 9 positions shown · non-contrast
Comparison: Previous exam(s).

CLINICAL DATA: Patient was recalled from her screening mammogram
for left breast calcifications.

EXAM:
DIGITAL DIAGNOSTIC UNILATERAL LEFT MAMMOGRAM WITH TOMOSYNTHESIS AND
CAD
TECHNIQUE: Left digital diagnostic mammography and breast tomosynthesis was
performed. The images were evaluated with computer-aided detection.

[L LM (1 of 2)]
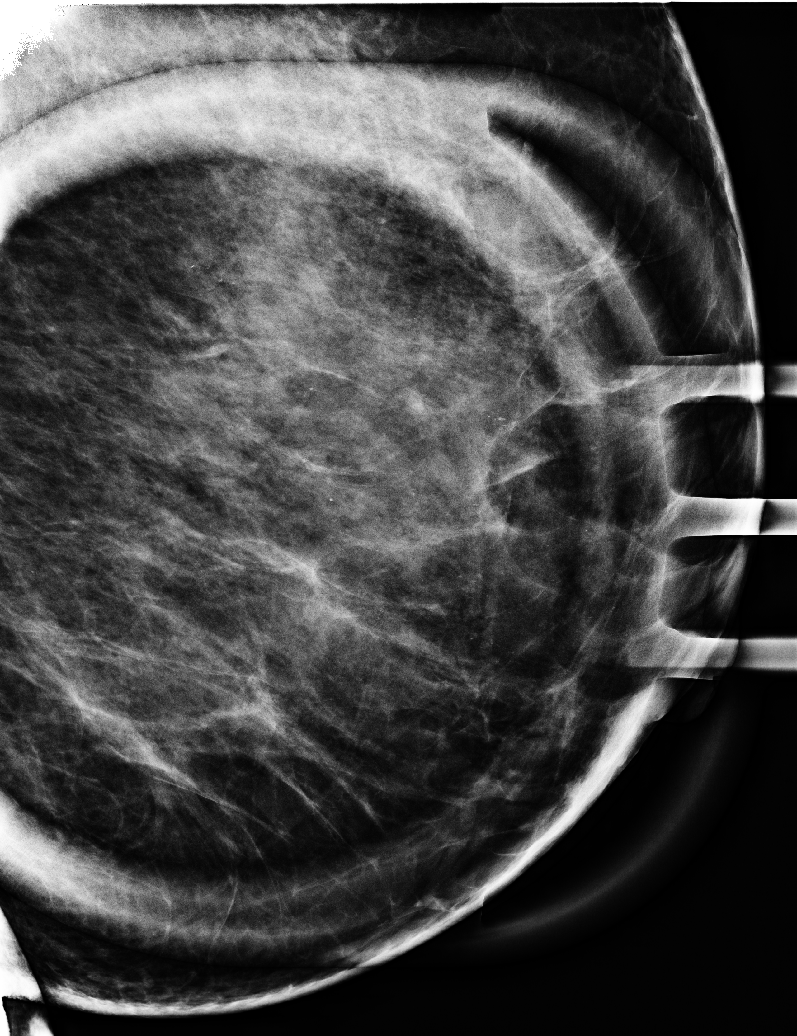

[L CC]
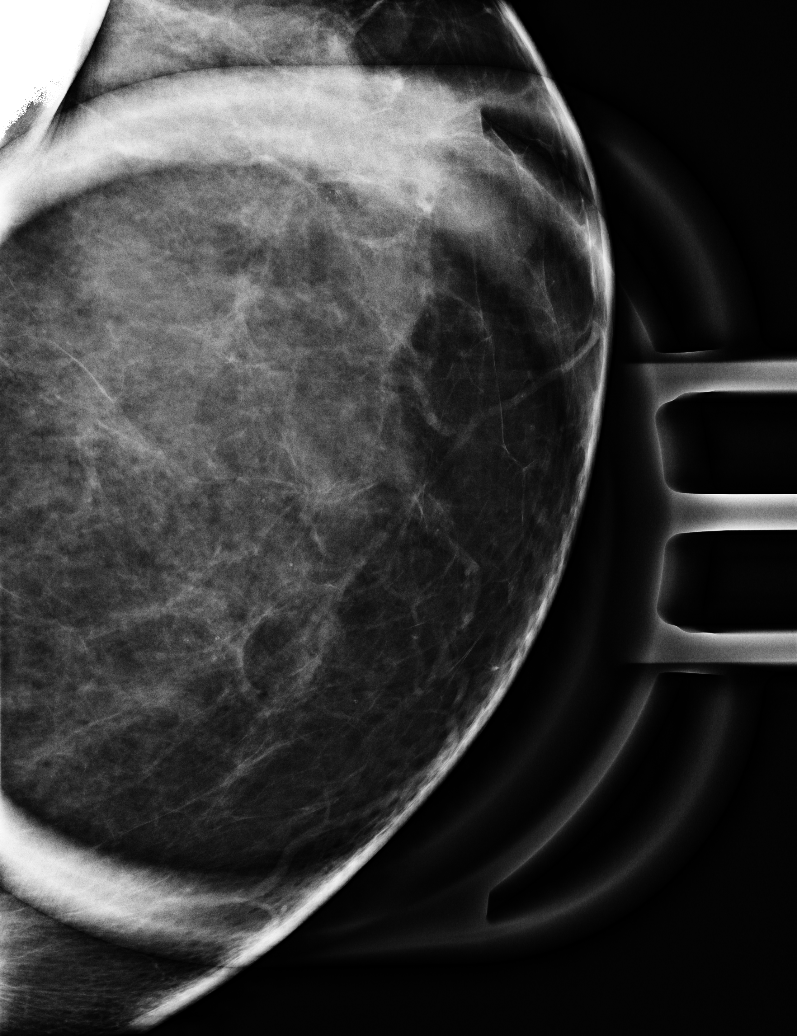

[L LM (2 of 2)]
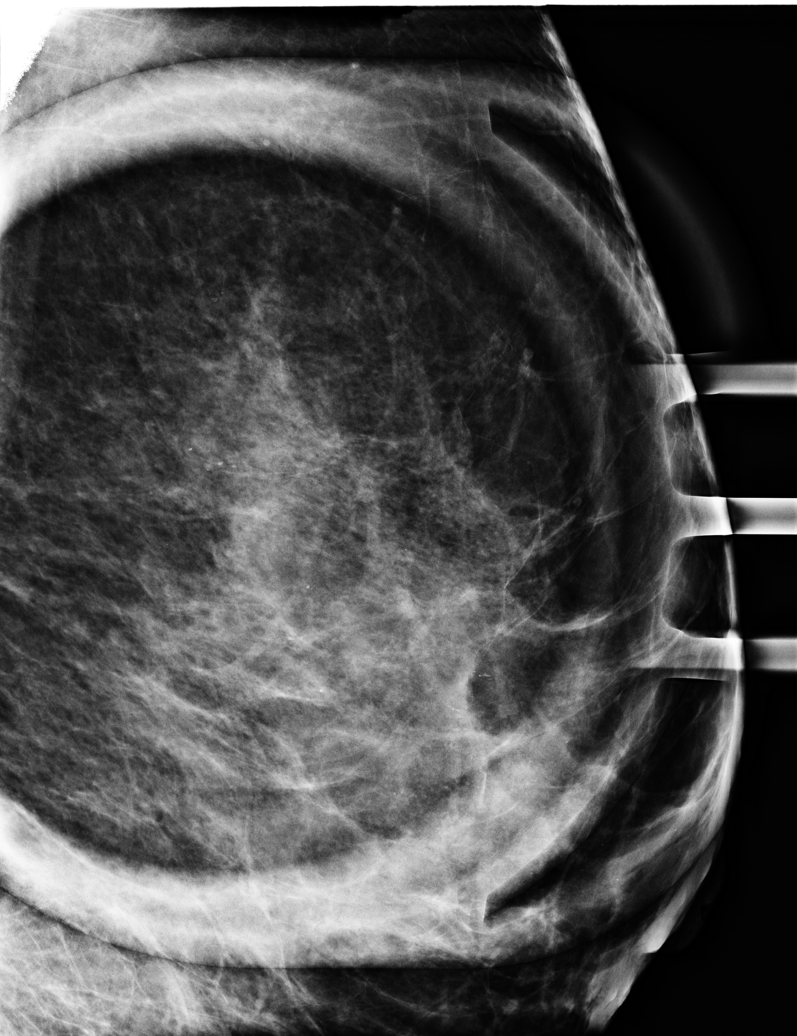

[L ML synth-2D]
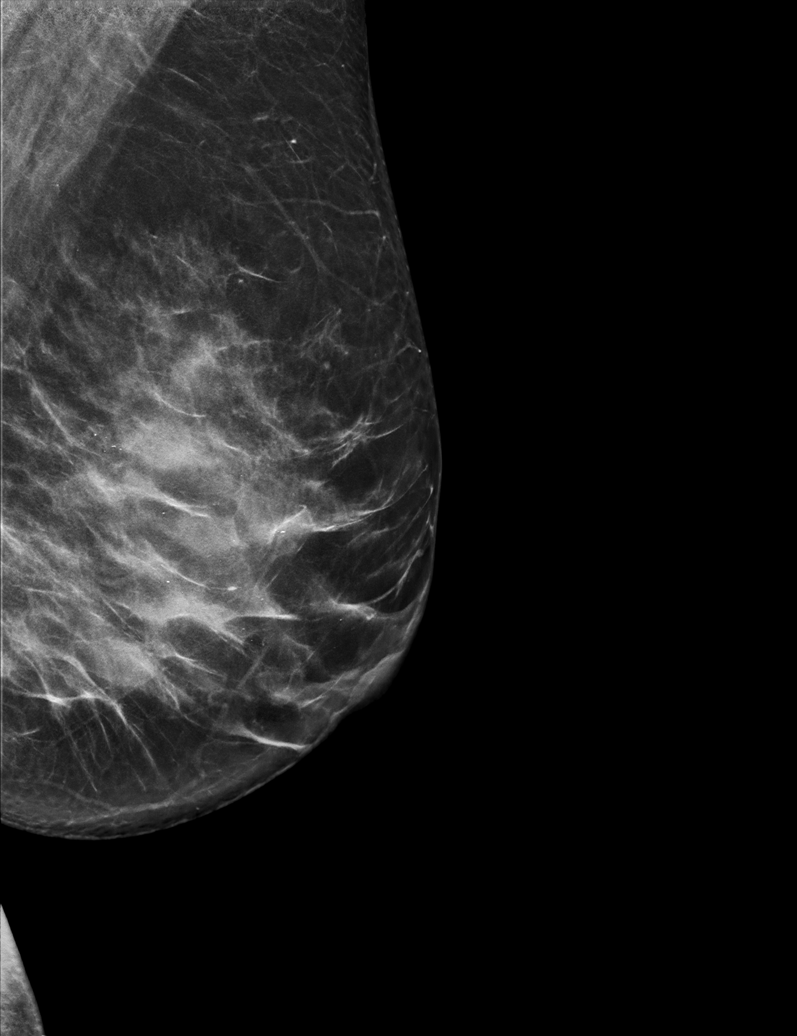

[L ML tomo · 2 of 74 frames shown]
[frame 24/74]
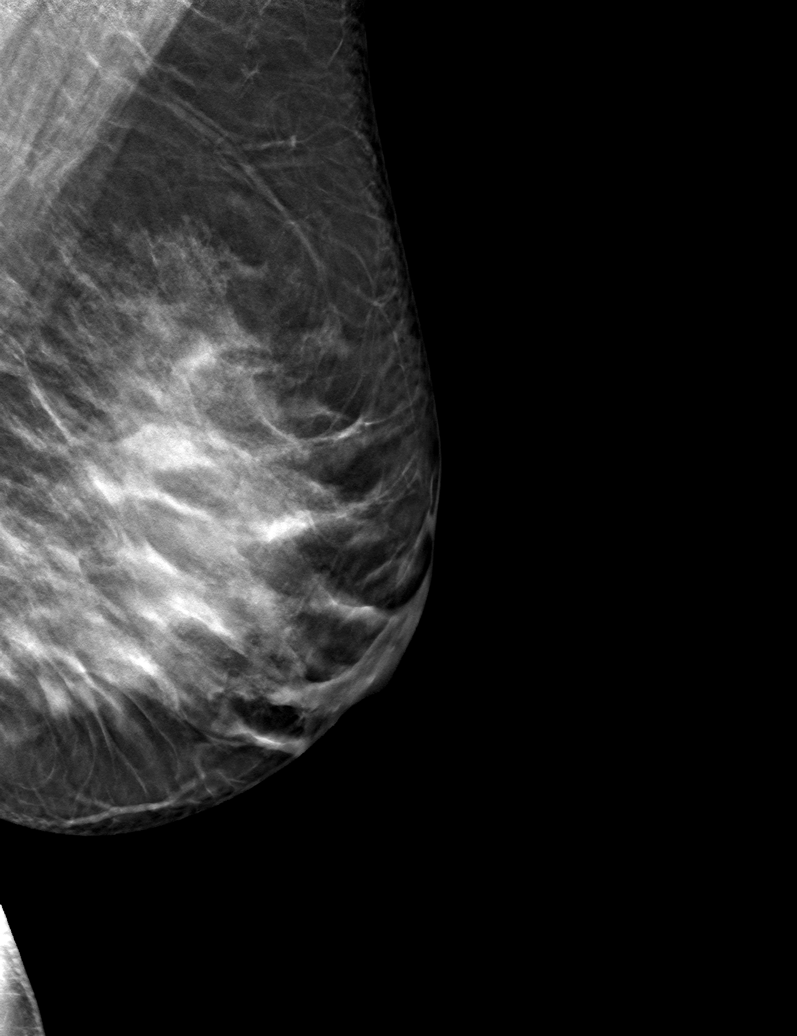
[frame 37/74]
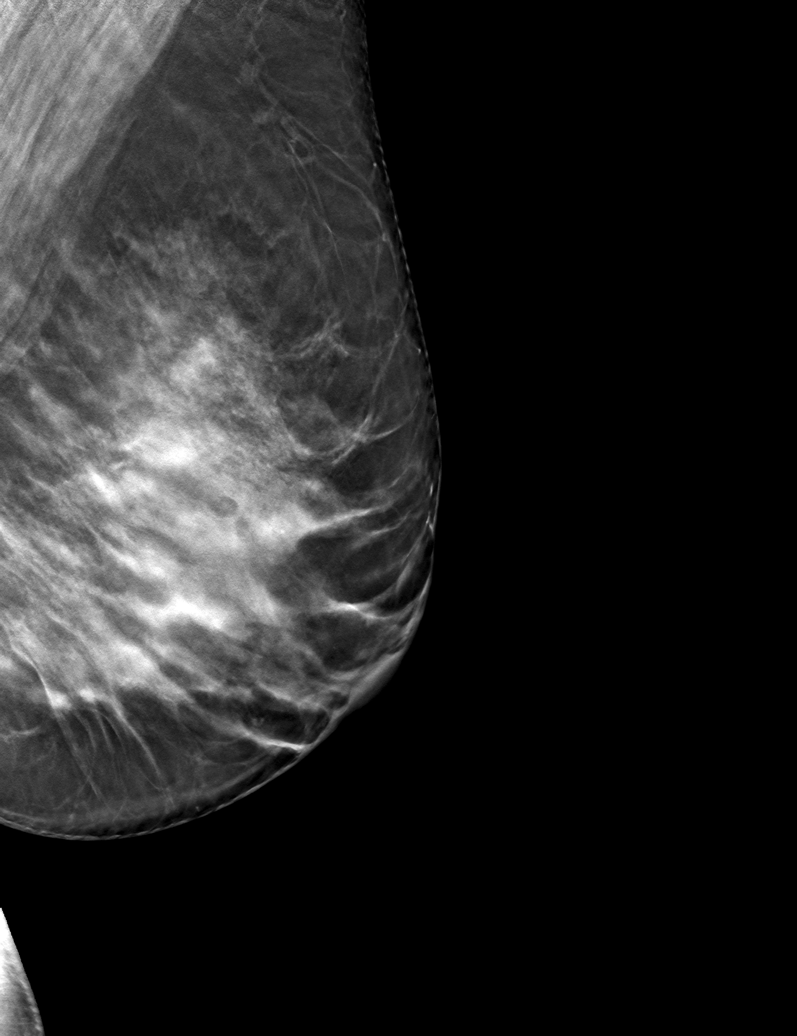

[6 of 9 positions shown; findings below may reference images not displayed]

ACR Breast Density Category c: The breast tissue is heterogeneously
dense, which may obscure small masses.
FINDINGS: Additional imaging of the left breast was performed. There are
diffuse punctate calcifications in the left breast some of which
have a differential appearance consistent with milk of calcium. No
suspicious calcifications identified.
IMPRESSION: No evidence of malignancy in the left breast.

RECOMMENDATION:
Bilateral screening mammogram in 1 year is recommended.

I have discussed the findings and recommendations with the patient.
If applicable, a reminder letter will be sent to the patient
regarding the next appointment.

BI-RADS CATEGORY  2: Benign.

## 2022-03-06 ENCOUNTER — Other Ambulatory Visit: Payer: Self-pay | Admitting: Internal Medicine

## 2022-03-06 ENCOUNTER — Other Ambulatory Visit: Payer: Self-pay | Admitting: Obstetrics and Gynecology

## 2022-03-06 DIAGNOSIS — Z1231 Encounter for screening mammogram for malignant neoplasm of breast: Secondary | ICD-10-CM

## 2022-04-03 ENCOUNTER — Ambulatory Visit
Admission: RE | Admit: 2022-04-03 | Discharge: 2022-04-03 | Disposition: A | Discharge status: Home / Self Care | Payer: 59 | Source: Ambulatory Visit | Attending: Internal Medicine | Admitting: Internal Medicine

## 2022-04-03 DIAGNOSIS — Z1231 Encounter for screening mammogram for malignant neoplasm of breast: Secondary | ICD-10-CM | POA: Diagnosis present

## 2022-04-21 ENCOUNTER — Ambulatory Visit: Payer: Self-pay

## 2023-02-25 ENCOUNTER — Other Ambulatory Visit: Payer: Self-pay | Admitting: Internal Medicine

## 2023-02-25 DIAGNOSIS — Z1231 Encounter for screening mammogram for malignant neoplasm of breast: Secondary | ICD-10-CM

## 2023-04-13 ENCOUNTER — Ambulatory Visit
Admission: RE | Admit: 2023-04-13 | Discharge: 2023-04-13 | Disposition: A | Discharge status: Home / Self Care | Payer: 59 | Source: Ambulatory Visit | Attending: Internal Medicine | Admitting: Internal Medicine

## 2023-04-13 DIAGNOSIS — Z1231 Encounter for screening mammogram for malignant neoplasm of breast: Secondary | ICD-10-CM | POA: Diagnosis present

## 2024-03-21 ENCOUNTER — Other Ambulatory Visit: Payer: Self-pay | Admitting: Internal Medicine

## 2024-03-21 DIAGNOSIS — Z1231 Encounter for screening mammogram for malignant neoplasm of breast: Secondary | ICD-10-CM

## 2024-04-04 NOTE — Progress Notes (Signed)
 History of Present Illness:   Angie Graves is a 43 y.o. female here for   Verbally consented to the use of AI for note-taking.   Chief Complaint  Patient presents with  . same day     Patient presenting today possible UTI      History of Present Illness Angie Graves is a 43 year old female who presents with worsening urinary tract infection symptoms.  She initially visited a walk-in clinic on March 21, 2024, where she was prescribed Macrobid for a viral infection. Her symptoms have worsened since then.  She describes persistent and worsening pain, which is not improving. She also reports back pain and chills for the past three days.  No decrease in urine output, but there is increased frequency and pain during urination, causing significant discomfort and fear of urination.  She is currently not taking Lasix (furosemide).   Past Medical History:   Past Medical History:  Diagnosis Date  . Abnormal uterine bleeding     Past Surgical History:   Past Surgical History:  Procedure Laterality Date  . CHOLECYSTECTOMY  01/28/2014    Allergies:  No Known Allergies  Current Medications:   Prior to Admission medications  Medication Sig Taking? Last Dose  copper intrauterine contraceptive (PARAGARD T 380A) IUD Insert 1 each into the uterus once Yes Taking  ergocalciferol, vitamin D2, 1,250 mcg (50,000 unit) capsule TAKE 1 CAPSULE (50,000 UNITS TOTAL) BY MOUTH EVERY 14 (FOURTEEN) DAYS FOR 180 DAYS Yes Taking  FUROsemide (LASIX) 40 MG tablet TAKE 1 TABLET EVERY DAY AS NEEDED. Yes Taking  ciprofloxacin HCl (CIPRO) 500 MG tablet Take 1 tablet (500 mg total) by mouth 2 (two) times daily for 7 days      Family History:   Family History  Problem Relation Name Age of Onset  . Ovarian cancer Mother    . No Known Problems Father    . No Known Problems Sister      Social History:   Social History   Socioeconomic History  . Marital status: Married   Tobacco Use  . Smoking status: Never  . Smokeless tobacco: Never  Vaping Use  . Vaping status: Never Used  Substance and Sexual Activity  . Alcohol use: No  . Drug use: Never  . Sexual activity: Yes    Partners: Male    Birth control/protection: I.U.D.    Comment: paragard  Other Topics Concern  . Would you please tell us  about the people who live in your home, your pets, or anything else important to your social life? Yes    Comment: husband and two daughter  Social History Narrative   ** Merged History Encounter **       Social Drivers of Corporate investment banker Strain: Low Risk  (10/24/2023)   Overall Financial Resource Strain (CARDIA)   . Difficulty of Paying Living Expenses: Not hard at all  Food Insecurity: No Food Insecurity (10/24/2023)   Hunger Vital Sign   . Worried About Programme researcher, broadcasting/film/video in the Last Year: Never true   . Ran Out of Food in the Last Year: Never true  Transportation Needs: No Transportation Needs (10/24/2023)   PRAPARE - Transportation   . Lack of Transportation (Medical): No   . Lack of Transportation (Non-Medical): No  Housing Stability: Low Risk  (10/24/2023)   Housing Stability Vital Sign   . Unable to Pay for Housing in the Last Year: No   . Number  of Times Moved in the Last Year: 0   . Homeless in the Last Year: No    Review of Systems:   A 10 point review of systems is negative, except for the pertinent positives and negatives detailed in the HPI.  Vitals:   Vitals:   04/04/24 1357  BP: 120/82  Pulse: 71  SpO2: 98%  Weight: 75.4 kg (166 lb 3.2 oz)  Height: 160 cm (5' 3)     Body mass index is 29.44 kg/m.  Physical Exam:   Physical Exam Vitals and nursing note reviewed.  Constitutional:      General: She is not in acute distress.    Appearance: Normal appearance. She is not ill-appearing, toxic-appearing or diaphoretic.  HENT:     Head: Normocephalic and atraumatic.     Right Ear: External ear normal.     Left Ear:  External ear normal.  Eyes:     Conjunctiva/sclera: Conjunctivae normal.  Cardiovascular:     Rate and Rhythm: Normal rate and regular rhythm.     Pulses: Normal pulses.     Heart sounds: Normal heart sounds.  Pulmonary:     Effort: Pulmonary effort is normal.     Breath sounds: Normal breath sounds.  Abdominal:     Tenderness: There is abdominal tenderness in the suprapubic area. There is no right CVA tenderness or left CVA tenderness.     Comments: Bilateral low back pain as well  Neurological:     Mental Status: She is alert.     Assessment and Plan:  No results found for this visit on 04/04/24.   Assessment & Plan Urinary tract infection with possible pyelonephritis Persistent UTI symptoms despite Macrobid suggest possible pyelonephritis. E. Coli identified, but symptom exacerbation indicates potential resistance or inadequate response. Differential includes pyelonephritis due to worsening symptoms and back pain, with risk of kidney involvement if untreated. - Prescribed ciprofloxacin 500 mg twice daily for 7 days. - Ordered urine culture to check for other bacterial growth. - Advised to monitor for ciprofloxacin side effects - Instructed to take 1000 mg of Tylenol every 8 hours for pain management. - Advised against using NSAIDs like ibuprofen  or Aleve due to potential kidney harm. - Instructed to seek emergency care if symptoms do not improve in 2-3 days or worsen, as IV antibiotics may be necessary.  Disposition: Follow-up as needed  Patient Instructions  It was nice to meet you.  I have called in ciprofloxacin for you to take for 7 days.  For the pain I would take at 1000 mg of Tylenol every day.  Avoid NSAIDs at this time.  If you are not improving in the next 3 days or you start to worsen let us  know and you may need to get IV antibiotics.   This note has been created using automated tools and reviewed for accuracy by provider.  Patient received an After Visit  Summary    Attestation Statement:   I personally performed the service, non-incident to. (WP)   MASON MCCLELLAND MINOR, PA

## 2024-04-07 ENCOUNTER — Emergency Department
Admission: EM | Admit: 2024-04-07 | Discharge: 2024-04-07 | Disposition: A | Discharge status: Home / Self Care | Attending: Emergency Medicine | Admitting: Emergency Medicine

## 2024-04-07 ENCOUNTER — Encounter: Payer: Self-pay | Admitting: Emergency Medicine

## 2024-04-07 ENCOUNTER — Emergency Department

## 2024-04-07 ENCOUNTER — Other Ambulatory Visit: Payer: Self-pay

## 2024-04-07 DIAGNOSIS — R300 Dysuria: Secondary | ICD-10-CM | POA: Insufficient documentation

## 2024-04-07 DIAGNOSIS — R1031 Right lower quadrant pain: Secondary | ICD-10-CM | POA: Insufficient documentation

## 2024-04-07 DIAGNOSIS — M545 Low back pain, unspecified: Secondary | ICD-10-CM | POA: Insufficient documentation

## 2024-04-07 LAB — COMPREHENSIVE METABOLIC PANEL WITH GFR
ALT: 22 U/L (ref 0–44)
AST: 20 U/L (ref 15–41)
Albumin: 4 g/dL (ref 3.5–5.0)
Alkaline Phosphatase: 55 U/L (ref 38–126)
Anion gap: 8 (ref 5–15)
BUN: 12 mg/dL (ref 6–20)
CO2: 23 mmol/L (ref 22–32)
Calcium: 8.6 mg/dL — ABNORMAL LOW (ref 8.9–10.3)
Chloride: 105 mmol/L (ref 98–111)
Creatinine, Ser: 0.66 mg/dL (ref 0.44–1.00)
GFR, Estimated: >60 mL/min (ref >60)
Glucose, Bld: 110 mg/dL — ABNORMAL HIGH (ref 70–99)
Potassium: 3.9 mmol/L (ref 3.5–5.1)
Sodium: 136 mmol/L (ref 135–145)
Total Bilirubin: 0.8 mg/dL (ref 0.0–1.2)
Total Protein: 7.6 g/dL (ref 6.5–8.1)

## 2024-04-07 LAB — CBC
HCT: 38.2 % (ref 36.0–46.0)
Hemoglobin: 12.8 g/dL (ref 12.0–15.0)
MCH: 30.5 pg (ref 26.0–34.0)
MCHC: 33.5 g/dL (ref 30.0–36.0)
MCV: 91.2 fL (ref 80.0–100.0)
Platelets: 216 K/uL (ref 150–400)
RBC: 4.19 MIL/uL (ref 3.87–5.11)
RDW: 12.2 % (ref 11.5–15.5)
WBC: 4.9 K/uL (ref 4.0–10.5)
nRBC: 0 % (ref 0.0–0.2)

## 2024-04-07 LAB — URINALYSIS, ROUTINE W REFLEX MICROSCOPIC
Bilirubin Urine: NEGATIVE
Glucose, UA: NEGATIVE mg/dL
Hgb urine dipstick: NEGATIVE
Ketones, ur: NEGATIVE mg/dL
Leukocytes,Ua: NEGATIVE
Nitrite: NEGATIVE
Protein, ur: NEGATIVE mg/dL
Specific Gravity, Urine: 1.013 (ref 1.005–1.030)
pH: 5 (ref 5.0–8.0)

## 2024-04-07 LAB — CHLAMYDIA/NGC RT PCR (ARMC ONLY)
Chlamydia Tr: NOT DETECTED
N gonorrhoeae: NOT DETECTED

## 2024-04-07 LAB — POC URINE PREG, ED: Preg Test, Ur: NEGATIVE

## 2024-04-07 LAB — LIPASE, BLOOD: Lipase: 28 U/L (ref 11–51)

## 2024-04-07 MED ORDER — SODIUM CHLORIDE 0.9 % IV BOLUS
1000.0000 mL | Freq: Once | INTRAVENOUS | Status: AC
  Administered 2024-04-07: 1000 mL via INTRAVENOUS

## 2024-04-07 MED ORDER — IOHEXOL 300 MG/ML  SOLN
100.0000 mL | Freq: Once | INTRAMUSCULAR | Status: AC | PRN
  Administered 2024-04-07: 100 mL via INTRAVENOUS

## 2024-04-07 MED ORDER — MORPHINE SULFATE (PF) 2 MG/ML IV SOLN
2.0000 mg | Freq: Once | INTRAVENOUS | Status: AC
  Administered 2024-04-07: 2 mg via INTRAVENOUS
  Filled 2024-04-07: qty 1

## 2024-04-07 NOTE — ED Triage Notes (Signed)
 Pt reports symptoms for approx 3 weeks, was seen by the dr on Monday who placed her on 500 mg ciprobid for 7 days, was told that if she wasn't any better in 3 days to come to the ER, pt is reporting dysuria, left flank pain, lower abd pain

## 2024-04-07 NOTE — Discharge Instructions (Signed)
 Please complete your course of antibiotics as prescribed by your doctor.  You can follow-up with your chlamydia and gonorrhea results on your MyChart.  Please make sure to follow-up with your doctor to get reassessed next week.

## 2024-04-07 NOTE — ED Provider Notes (Signed)
 Angie Graves Provider Note    Event Date/Time   First MD Initiated Contact with Patient 04/07/24 1110     (approximate)   History   Back Pain, Abdominal Pain, and Dysuria   HPI  Angie Graves is a 43 y.o. female with prior history of cholecystectomy, chronic venous stasis, presenting with flank pain and lower quadrant abdominal pain as well as dysuria.  Symptoms started 3 weeks ago, was seen by her doctor on Monday and prescribed 7 days of Cipro.  Was told that if her symptoms does not get better in 3 days to go to the ER.  She denies any nausea vomiting or diarrhea, no vaginal discharge, no history of STIs.  She is sexually active with a single partner.  Denies prior history of kidney stones.  No fever.  Does note chills at home.  States that the antibiotics have not been working.  No trauma or falls.  On independent review, she was seen by primary care on the 20th, had presented with back pain and chills, did note increased urinary frequency and dysuria.  Had been seen at walk-in clinic on October 6 where she was prescribed Macrobid for viral infection, states that her symptoms got worse since then.  She was ordered for urine culture, prescribed ciprofloxacin 500 mg twice a day for 7 days.  UA done on the 20th shows no leukocytes or nitrites, 2 WBCs, 0-5 bacteria, 0 squamous cells.     Physical Exam   Triage Vital Signs: ED Triage Vitals  Encounter Vitals Group     BP 04/07/24 1049 (!) 143/97     Girls Systolic BP Percentile --      Girls Diastolic BP Percentile --      Boys Systolic BP Percentile --      Boys Diastolic BP Percentile --      Pulse Rate 04/07/24 1049 73     Resp 04/07/24 1049 16     Temp 04/07/24 1049 97.6 F (36.4 C)     Temp Source 04/07/24 1049 Oral     SpO2 04/07/24 1049 100 %     Weight 04/07/24 1050 166 lb (75.3 kg)     Height 04/07/24 1050 5' 3 (1.6 m)     Head Circumference --      Peak Flow --      Pain Score  04/07/24 1050 8     Pain Loc --      Pain Education --      Exclude from Growth Chart --     Most recent vital signs: Vitals:   04/07/24 1049  BP: (!) 143/97  Pulse: 73  Resp: 16  Temp: 97.6 F (36.4 C)  SpO2: 100%     General: Awake, no distress.  CV:  Good peripheral perfusion.  Resp:  Normal effort.  Abd:  No distention.  Soft, tender to the right lower quadrant without guarding Other:  No flank or CVA tenderness, she does have tenderness to palpation to her bilateral paralumbar region without midline spinal tenderness or saddle anesthesia.  No focal weakness or numbness, she is steady gait with ambulation, nontoxic-appearing.   ED Results / Procedures / Treatments   Labs (all labs ordered are listed, but only abnormal results are displayed) Labs Reviewed  COMPREHENSIVE METABOLIC PANEL WITH GFR - Abnormal; Notable for the following components:      Result Value   Glucose, Bld 110 (*)    Calcium 8.6 (*)  All other components within normal limits  URINALYSIS, ROUTINE W REFLEX MICROSCOPIC - Abnormal; Notable for the following components:   Color, Urine YELLOW (*)    APPearance CLEAR (*)    All other components within normal limits  URINE CULTURE  CHLAMYDIA/NGC RT PCR ([facility] ONLY)            LIPASE, BLOOD  CBC  POC URINE PREG, ED     RADIOLOGY On my independent interpretation, CT imaging without obvious hydronephrosis   PROCEDURES:  Critical Care performed: No  Procedures   MEDICATIONS ORDERED IN ED: Medications  sodium chloride 0.9 % bolus 1,000 mL (1,000 mLs Intravenous New Bag/Given 04/07/24 1153)  morphine (PF) 2 MG/ML injection 2 mg (2 mg Intravenous Given 04/07/24 1153)  iohexol (OMNIPAQUE) 300 MG/ML solution 100 mL (100 mLs Intravenous Contrast Given 04/07/24 1236)     IMPRESSION / MDM / ASSESSMENT AND PLAN / ED COURSE  I reviewed the triage vital signs and the nursing notes.                              Differential diagnosis includes,  but is not limited to, bladder irritation, UTI, pyelonephritis, nephrolithiasis, appendicitis, colitis, diverticulitis, musculoskeletal pain, strain.  Will get labs, UA, urine culture did discuss with her about STI testing and she is agreeable to proceed with testing for GC chlamydia, she has no other risk factors reported.  Will also get a CT abdomen pelvis.  Fluids, IV morphine.  Patient's presentation is most consistent with acute presentation with potential threat to life or bodily function.  Independent interpretation of labs and imaging below.  Labs are reassuring, CT shows mild wall thickening of the bladder that could be due to cystitis, her UA is not consistent with UTI but could be because she already started antibiotics.  Will have her complete her course of antibiotics prescribed by her primary care doctor and follow-up with them for further management and reassessment.  Did discuss with her to follow-up with her STI testing in her MyChart.  Otherwise considered but no indication for inpatient admission at this time, she safe for outpatient management.  On reassessment she is feeling a lot better.  Strict return precautions given.  Shared decision making done with patient and she is agreeable with this plan.  Discharge.  Clinical Course as of 04/07/24 1325  Thu Apr 07, 2024  1130 Independent review of labs, pregnancy test is negative, lipase is not elevated, electrolytes not severely deranged, LFTs are normal, no leukocytosis. [TT]  1151 Urinalysis, Routine w reflex microscopic -Urine, Clean Catch(!) Negative for leukocytes, nitrites. [TT]  1322 CT ABDOMEN PELVIS W CONTRAST IMPRESSION: 1. No acute findings in the abdomen/pelvis. Normal appendix. 2. Minimal circumferential wall thickening of the bladder which could be seen due to mild cystitis. 3. Subcentimeter left renal cortical hypodensity too small to characterize, but likely a cyst. No follow-up imaging recommended.   [TT]     Clinical Course User Index [TT] Waymond Lorelle Cummins, MD     FINAL CLINICAL IMPRESSION(S) / ED DIAGNOSES   Final diagnoses:  Dysuria  Acute bilateral low back pain without sciatica  Right lower quadrant abdominal pain     Rx / DC Orders   ED Discharge Orders     None        Note:  This document was prepared using Dragon voice recognition software and may include unintentional dictation errors.    Waymond Lorelle  Jerri, MD 04/07/24 1325

## 2024-04-07 NOTE — ED Notes (Signed)
 See triage note Presents with some lower abd and back pain  States she also has had some dyusria  Sxs; started about 3 weeks ago   Afebrile on arrival

## 2024-04-08 LAB — URINE CULTURE: Culture: NO GROWTH

## 2024-04-20 ENCOUNTER — Ambulatory Visit
Admission: RE | Admit: 2024-04-20 | Discharge: 2024-04-20 | Disposition: A | Discharge status: Home / Self Care | Source: Ambulatory Visit | Attending: Internal Medicine | Admitting: Internal Medicine

## 2024-04-20 DIAGNOSIS — Z1231 Encounter for screening mammogram for malignant neoplasm of breast: Secondary | ICD-10-CM | POA: Insufficient documentation

## 2024-06-14 ENCOUNTER — Ambulatory Visit: Admitting: Urology

## 2024-06-14 ENCOUNTER — Encounter: Payer: Self-pay | Admitting: Urology

## 2024-06-14 VITALS — BP 131/81 | HR 72 | Ht 63.0 in | Wt 162.0 lb

## 2024-06-14 DIAGNOSIS — R3129 Other microscopic hematuria: Secondary | ICD-10-CM

## 2024-06-14 DIAGNOSIS — R300 Dysuria: Secondary | ICD-10-CM

## 2024-06-14 MED ORDER — OXYBUTYNIN CHLORIDE 5 MG PO TABS: 5.0000 mg | ORAL_TABLET | Freq: Three times a day (TID) | ORAL | 0 refills | Status: DC

## 2024-06-14 NOTE — Progress Notes (Signed)
 "  06/14/2024 4:38 PM   MACLAINE AHOLA Alvarenga November 23, 1980 [id number]  Referring provider: Sadie Manna, MD [address] [facility] [place],  [place] [address]  Chief Complaint  Patient presents with   Other    HPI: Angie Graves is a 43 y.o. female referred for evaluation of dysuria.  She declined a Spanish interpreter.  Urgent care visit 03/21/2024 complaining of dysuria, frequency with voiding small amounts.  Urinalysis with significant pyuria.  Was treated with Macrobid.  Urine culture subsequently grew pansensitive E. Coli Had recurrent symptoms and was seen again 04/04/2024 with similar complaints.  Urinalysis showed 2+ blood on dipstick but negative microscopy.  Urine culture grew pansensitive E. coli.  Started on Cipro ED visit 04/07/2024 with complaints of flank and lower quadrant abdominal pain with dysuria.  Urinalysis in the ED was unremarkable and a urine culture was negative.  Negative testing for chlamydia/GC.  CT abdomen pelvis with contrast showed no significant abnormalities.  PCP follow-up was recommended Her symptoms have improved though still complaining of dysuria and pain before and after urination.  She has intermittent left pelvic pain.  PMH: Past Medical History:  Diagnosis Date   Varicose veins of both lower extremities     Surgical History: Past Surgical History:  Procedure Laterality Date   CHOLECYSTECTOMY     LASER ABLATION Bilateral     Home Medications:  Allergies as of 06/14/2024   No Known Allergies      Medication List        Accurate as of June 14, 2024  4:38 PM. If you have any questions, ask your nurse or doctor.          Hydromet 5-1.5 MG/5ML syrup Generic drug: HYDROcodone-homatropine   ibuprofen  800 MG tablet Commonly known as: ADVIL  Take 1 tablet (800 mg total) by mouth every 8 (eight) hours as needed.   iron polysaccharides 150 MG capsule Commonly known as: NIFEREX Take by  mouth.   MIRENA (52 MG) IU by Intrauterine route.   omeprazole 20 MG capsule Commonly known as: PRILOSEC   oxybutynin 5 MG tablet Commonly known as: DITROPAN Take 1 tablet (5 mg total) by mouth 3 (three) times daily. Started by: Glendia Barba, MD   spironolactone 25 MG tablet Commonly known as: ALDACTONE Take 50 mg by mouth.   tretinoin 0.05 % cream Commonly known as: RETIN-A Wash face and wait at least 20 minutes.  Apply a pea size amount to the face except upper eyelids. Skip a few nights if you get too dry   triamcinolone  ointment 0.5 % Commonly known as: KENALOG  Apply 1 application topically 2 (two) times daily.        Allergies: Allergies[1]  Family History: Family History  Family history unknown: Yes    Social History:  reports that she has never smoked. She has never used smokeless tobacco. She reports that she does not drink alcohol and does not use drugs.   Physical Exam: BP 131/81   Pulse 72   Ht 5' 3 (1.6 m)   Wt 162 lb (73.5 kg)   BMI 28.70 kg/m   Constitutional:  Alert, No acute distress. HEENT:  AT Respiratory: Normal respiratory effort, no increased work of breathing. Psychiatric: Normal mood and affect.  Laboratory Data:  Urinalysis Dipstick trace blood Microscopy 3-10 RBC/negative WBC/negative epis   Pertinent Imaging: CT images October 2025 were personally viewed and interpreted   Assessment & Plan:    1.  Chronic dysuria Urine  sent for Ureaplasma/mycoplasma testing Trial oxybutynin 5 mg 3 times daily as needed  2. Microhematuria Recent CT abdomen pelvis with contrast showed no upper tract abnormalities Cystoscopy for lower tract evaluation   Glendia JAYSON Barba, MD  [facility] [address], [address] [place], [place] [address] [phone]    [1] No Known Allergies  "

## 2024-06-14 NOTE — Patient Instructions (Signed)
 Ct renal stone study call to schedule [phone]

## 2024-06-15 LAB — URINALYSIS, COMPLETE
Bilirubin, UA: NEGATIVE
Glucose, UA: NEGATIVE
Ketones, UA: NEGATIVE
Nitrite, UA: NEGATIVE
Protein,UA: NEGATIVE
Specific Gravity, UA: 1.02 (ref 1.005–1.030)
Urobilinogen, Ur: 0.2 mg/dL (ref 0.2–1.0)
pH, UA: 6 (ref 5.0–7.5)

## 2024-06-15 LAB — MICROSCOPIC EXAMINATION: Bacteria, UA: NONE SEEN

## 2024-06-20 ENCOUNTER — Telehealth: Payer: Self-pay | Admitting: Urology

## 2024-06-20 LAB — MYCOPLASMA / UREAPLASMA CULTURE

## 2024-06-20 NOTE — Telephone Encounter (Signed)
 Called patient and lvm for her to call office. She needs to schedule Cysto with Dr. Twylla.

## 2024-06-28 ENCOUNTER — Ambulatory Visit: Admitting: Urology

## 2024-06-28 VITALS — BP 113/74 | HR 82 | Ht 63.0 in | Wt 162.0 lb

## 2024-06-28 DIAGNOSIS — R3129 Other microscopic hematuria: Secondary | ICD-10-CM

## 2024-06-28 DIAGNOSIS — R300 Dysuria: Secondary | ICD-10-CM

## 2024-06-28 LAB — MICROSCOPIC EXAMINATION: RBC, Urine: >30 /HPF — AB (ref 0–2)

## 2024-06-28 LAB — URINALYSIS, COMPLETE
Bilirubin, UA: NEGATIVE
Glucose, UA: NEGATIVE
Ketones, UA: NEGATIVE
Leukocytes,UA: NEGATIVE
Nitrite, UA: NEGATIVE
Protein,UA: NEGATIVE
Specific Gravity, UA: <1.005 — ABNORMAL LOW (ref 1.005–1.030)
Urobilinogen, Ur: 0.2 mg/dL (ref 0.2–1.0)
pH, UA: 6 (ref 5.0–7.5)

## 2024-06-28 MED ORDER — SOLIFENACIN SUCCINATE 5 MG PO TABS: 5.0000 mg | ORAL_TABLET | Freq: Every day | ORAL | 3 refills | Status: AC

## 2024-06-28 MED ORDER — NITROFURANTOIN MONOHYD MACRO 100 MG PO CAPS
100.0000 mg | ORAL_CAPSULE | Freq: Once | ORAL | Status: AC
  Administered 2024-06-28: 100 mg via ORAL

## 2024-06-28 NOTE — Progress Notes (Unsigned)
" ° °  06/28/2024  CC:  Chief Complaint  Patient presents with   Cysto    HPI: Refer to my prior note 06/14/2024.  Symptoms resolved with immediate release oxybutynin  however she is taking 3 times daily.  UA >30 RBC however she is currently menstruating  Blood pressure 113/74, pulse 82, height 5' 3 (1.6 m), weight 162 lb (73.5 kg). NED. A&Ox3.   No respiratory distress   Abd soft, NT, ND Normal external genitalia with patent urethral meatus  Cystoscopy Procedure Note  Patient identification was confirmed, informed consent was obtained, and patient was prepped using Betadine solution.  Lidocaine jelly was administered per urethral meatus.    Procedure: - Flexible cystoscope introduced, without any difficulty.   - Thorough search of the bladder revealed:    normal urethral meatus    normal urothelium    no stones    no ulcers     no tumors    no urethral polyps    no trabeculation  - Ureteral orifices were normal in position and appearance.  Post-Procedure: - Patient tolerated the procedure well  Assessment/ Plan: Negative cystoscopy OAB symptoms resolved with immediate release oxybutynin  Start solifenacin  5 mg daily 1 year follow-up with UA    Glendia JAYSON Barba, MD "

## 2025-06-28 ENCOUNTER — Ambulatory Visit: Admitting: Urology
